# Patient Record
Sex: Male | Born: 1940
Health system: Southern US, Community
[De-identification: ages and names within clinical notes are randomized; demographics above are authoritative.]

## PROBLEM LIST (undated history)

## (undated) DIAGNOSIS — I700 Atherosclerosis of aorta: Secondary | ICD-10-CM

## (undated) DIAGNOSIS — F32A Depression, unspecified: Secondary | ICD-10-CM

## (undated) DIAGNOSIS — C4491 Basal cell carcinoma of skin, unspecified: Secondary | ICD-10-CM

## (undated) DIAGNOSIS — G4700 Insomnia, unspecified: Secondary | ICD-10-CM

## (undated) DIAGNOSIS — N3010 Interstitial cystitis (chronic) without hematuria: Secondary | ICD-10-CM

## (undated) DIAGNOSIS — Z85828 Personal history of other malignant neoplasm of skin: Secondary | ICD-10-CM

## (undated) DIAGNOSIS — Z87442 Personal history of urinary calculi: Secondary | ICD-10-CM

## (undated) DIAGNOSIS — F329 Major depressive disorder, single episode, unspecified: Secondary | ICD-10-CM

## (undated) DIAGNOSIS — K219 Gastro-esophageal reflux disease without esophagitis: Secondary | ICD-10-CM

## (undated) DIAGNOSIS — E785 Hyperlipidemia, unspecified: Secondary | ICD-10-CM

## (undated) DIAGNOSIS — C61 Malignant neoplasm of prostate: Secondary | ICD-10-CM

## (undated) HISTORY — PX: COLONOSCOPY: SHX174

## (undated) HISTORY — PX: NO PAST SURGERIES: SHX2092

---

## 2007-10-17 ENCOUNTER — Encounter (INDEPENDENT_AMBULATORY_CARE_PROVIDER_SITE_OTHER): Payer: Self-pay | Admitting: Gastroenterology

## 2007-10-17 ENCOUNTER — Ambulatory Visit (HOSPITAL_COMMUNITY): Admission: RE | Admit: 2007-10-17 | Discharge: 2007-10-17 | Payer: Self-pay | Admitting: Gastroenterology

## 2009-10-09 ENCOUNTER — Ambulatory Visit (HOSPITAL_COMMUNITY): Admission: RE | Admit: 2009-10-09 | Discharge: 2009-10-09 | Payer: Self-pay | Admitting: Family Medicine

## 2010-07-03 LAB — BASIC METABOLIC PANEL
Calcium: 9.5 mg/dL (ref 8.4–10.5)
Creatinine, Ser: 2.12 mg/dL — ABNORMAL HIGH (ref 0.4–1.5)
GFR calc Af Amer: 38 mL/min — ABNORMAL LOW (ref >60)
Glucose, Bld: 90 mg/dL (ref 70–99)
Sodium: 137 mEq/L (ref 135–145)

## 2014-02-08 ENCOUNTER — Encounter (HOSPITAL_COMMUNITY): Payer: Self-pay | Admitting: Emergency Medicine

## 2014-02-08 ENCOUNTER — Emergency Department (HOSPITAL_COMMUNITY): Payer: Medicare Other

## 2014-02-08 ENCOUNTER — Emergency Department (HOSPITAL_COMMUNITY)
Admission: EM | Admit: 2014-02-08 | Discharge: 2014-02-08 | Disposition: A | Discharge status: Home / Self Care | Payer: Medicare Other | Attending: Emergency Medicine | Admitting: Emergency Medicine

## 2014-02-08 DIAGNOSIS — W132XXA Fall from, out of or through roof, initial encounter: Secondary | ICD-10-CM | POA: Diagnosis not present

## 2014-02-08 DIAGNOSIS — Y9289 Other specified places as the place of occurrence of the external cause: Secondary | ICD-10-CM | POA: Insufficient documentation

## 2014-02-08 DIAGNOSIS — I803 Phlebitis and thrombophlebitis of lower extremities, unspecified: Secondary | ICD-10-CM | POA: Insufficient documentation

## 2014-02-08 DIAGNOSIS — Y9389 Activity, other specified: Secondary | ICD-10-CM | POA: Diagnosis not present

## 2014-02-08 DIAGNOSIS — M7989 Other specified soft tissue disorders: Secondary | ICD-10-CM

## 2014-02-08 DIAGNOSIS — S8012XA Contusion of left lower leg, initial encounter: Secondary | ICD-10-CM | POA: Insufficient documentation

## 2014-02-08 DIAGNOSIS — S8992XA Unspecified injury of left lower leg, initial encounter: Secondary | ICD-10-CM | POA: Diagnosis present

## 2014-02-08 DIAGNOSIS — M79609 Pain in unspecified limb: Secondary | ICD-10-CM

## 2014-02-08 DIAGNOSIS — Z79899 Other long term (current) drug therapy: Secondary | ICD-10-CM | POA: Insufficient documentation

## 2014-02-08 NOTE — ED Notes (Signed)
Pt states he was standing on the roof of his pool shelter (approximately 3 1/2 feet high) and fell though it.  Pt states initially he only had abrasions to the left leg.  Yesterday a knot came up just below his knee medially and he has L foot swelling.  Pt states he has been ambulating on the extremity without injury.

## 2014-02-08 NOTE — ED Provider Notes (Addendum)
CSN: [id number]     Arrival date & time 02/08/14  1311 History   First MD Initiated Contact with Patient 02/08/14 1327     Chief Complaint  Patient presents with  . Leg Injury     (Consider location/radiation/quality/duration/timing/severity/associated sxs/prior Treatment) HPI Comments: Pt with initial injury 6 days pta, who was able to walk and stand with minimal pain but then 3 days pta drove to [facility] and back and started to notice increased swelling, new calf pain  Patient is a 73 y.o. male presenting with leg pain. The history is provided by the patient.  Leg Pain Location:  Leg Time since incident:  6 days Injury: yes   Mechanism of injury: fall   Fall:    Fall occurred: fell several feet from a roof onto a pool motor.   Impact surface:  Concrete   Point of impact:  Feet Leg location:  L lower leg Pain details:    Quality:  Aching   Radiates to:  Does not radiate   Severity:  Mild   Onset quality:  Gradual   Duration:  6 days   Timing:  Constant   Progression:  Worsening Chronicity:  New Relieved by:  Rest and elevation Worsened by:  Nothing tried Ineffective treatments:  None tried Associated symptoms comment:  Came today due to worsening bruising and swelling and new calf pain   History reviewed. No pertinent past medical history. History reviewed. No pertinent past surgical history. No family history on file. History  Substance Use Topics  . Smoking status: Never Smoker   . Smokeless tobacco: Not on file  . Alcohol Use: Yes    Review of Systems  Respiratory: Negative for cough, chest tightness and shortness of breath.   All other systems reviewed and are negative.     Allergies  Review of patient's allergies indicates no known allergies.  Home Medications   Prior to Admission medications   Medication Sig Start Date End Date Taking? Authorizing Provider  atorvastatin (LIPITOR) 40 MG tablet Take 40 mg by mouth daily.   Yes Historical Provider, MD    esomeprazole (NEXIUM) 40 MG capsule Take 40 mg by mouth daily at 12 noon.   Yes Historical Provider, MD   BP 147/82  Pulse 86  Resp 18  SpO2 96% Physical Exam  Nursing note and vitals reviewed. Constitutional: He is oriented to person, place, and time. He appears well-developed and well-nourished. No distress.  HENT:  Head: Normocephalic and atraumatic.  Eyes: EOM are normal. Pupils are equal, round, and reactive to light.  Cardiovascular: Normal rate.   Pulmonary/Chest: Effort normal.  Musculoskeletal: He exhibits tenderness.       Legs: Neurological: He is alert and oriented to person, place, and time.    ED Course  Procedures (including critical care time) Labs Review Labs Reviewed - No data to display  Imaging Review Dg Tibia/fibula Left  02/08/2014   CLINICAL DATA:  Patient fell through a roof Monday injuring his left leg. Patient has bruising along the leg and foot edema.  EXAM: LEFT TIBIA AND FIBULA - 2 VIEW  COMPARISON:  None.  FINDINGS: No fracture or bone lesion. Knee and ankle joints are normally space and aligned. Mild subcutaneous soft tissue edema is seen diffusely. No radiopaque foreign bodies.  IMPRESSION: No fracture or dislocation.   Electronically Signed   By: Lajean Manes M.D.   On: 02/08/2014 14:04     EKG Interpretation None      MDM  Final diagnoses:  Leg hematoma, left, initial encounter   Swelling, ecchymosis and calf pain with hematoma.  however due to recent trauma then travel will r/o dvt.  X-ray neg.  Doppler pending  3:21 PM Duplex neg and only mild thrombophlebitis.  Will d/c home.   Blanchie Dessert, MD 02/08/14 [place], MD 02/08/14 [phone]

## 2014-02-08 NOTE — Progress Notes (Signed)
VASCULAR LAB PRELIMINARY  PRELIMINARY  PRELIMINARY  PRELIMINARY  Left lower extremity venous Doppler completed.    Preliminary report:  There is no DVT or SVT noted in the left lower extremity.  Violetta Lavalle, RVT 02/08/2014, 3:10 PM

## 2014-02-08 NOTE — ED Notes (Signed)
Pt transported to vascular.  °

## 2014-09-29 DIAGNOSIS — R972 Elevated prostate specific antigen [PSA]: Secondary | ICD-10-CM | POA: Diagnosis not present

## 2014-10-06 DIAGNOSIS — N401 Enlarged prostate with lower urinary tract symptoms: Secondary | ICD-10-CM | POA: Diagnosis not present

## 2014-10-06 DIAGNOSIS — N138 Other obstructive and reflux uropathy: Secondary | ICD-10-CM | POA: Diagnosis not present

## 2014-10-06 DIAGNOSIS — R972 Elevated prostate specific antigen [PSA]: Secondary | ICD-10-CM | POA: Diagnosis not present

## 2014-12-29 DIAGNOSIS — R208 Other disturbances of skin sensation: Secondary | ICD-10-CM | POA: Diagnosis not present

## 2014-12-29 DIAGNOSIS — Z23 Encounter for immunization: Secondary | ICD-10-CM | POA: Diagnosis not present

## 2015-01-05 DIAGNOSIS — R972 Elevated prostate specific antigen [PSA]: Secondary | ICD-10-CM | POA: Diagnosis not present

## 2015-03-19 DIAGNOSIS — R972 Elevated prostate specific antigen [PSA]: Secondary | ICD-10-CM | POA: Diagnosis not present

## 2015-03-30 DIAGNOSIS — N138 Other obstructive and reflux uropathy: Secondary | ICD-10-CM | POA: Diagnosis not present

## 2015-03-30 DIAGNOSIS — R972 Elevated prostate specific antigen [PSA]: Secondary | ICD-10-CM | POA: Diagnosis not present

## 2015-03-30 DIAGNOSIS — N401 Enlarged prostate with lower urinary tract symptoms: Secondary | ICD-10-CM | POA: Diagnosis not present

## 2015-03-30 DIAGNOSIS — N402 Nodular prostate without lower urinary tract symptoms: Secondary | ICD-10-CM | POA: Diagnosis not present

## 2015-04-20 DIAGNOSIS — C61 Malignant neoplasm of prostate: Secondary | ICD-10-CM | POA: Diagnosis not present

## 2015-04-20 DIAGNOSIS — N402 Nodular prostate without lower urinary tract symptoms: Secondary | ICD-10-CM | POA: Diagnosis not present

## 2015-04-20 DIAGNOSIS — R972 Elevated prostate specific antigen [PSA]: Secondary | ICD-10-CM | POA: Diagnosis not present

## 2015-04-30 ENCOUNTER — Other Ambulatory Visit (HOSPITAL_COMMUNITY): Payer: Self-pay | Admitting: Urology

## 2015-04-30 DIAGNOSIS — Z0000 Encounter for general adult medical examination without abnormal findings: Secondary | ICD-10-CM | POA: Diagnosis not present

## 2015-04-30 DIAGNOSIS — C61 Malignant neoplasm of prostate: Secondary | ICD-10-CM | POA: Diagnosis not present

## 2015-05-04 ENCOUNTER — Encounter (HOSPITAL_COMMUNITY)
Admission: RE | Admit: 2015-05-04 | Discharge: 2015-05-04 | Disposition: A | Discharge status: Home / Self Care | Payer: Medicare Other | Source: Ambulatory Visit | Attending: Urology | Admitting: Urology

## 2015-05-04 DIAGNOSIS — C61 Malignant neoplasm of prostate: Secondary | ICD-10-CM | POA: Insufficient documentation

## 2015-05-04 MED ORDER — TECHNETIUM TC 99M MEDRONATE IV KIT
25.0000 | PACK | Freq: Once | INTRAVENOUS | Status: AC | PRN
  Administered 2015-05-04: 25 via INTRAVENOUS

## 2015-05-24 DIAGNOSIS — H43813 Vitreous degeneration, bilateral: Secondary | ICD-10-CM | POA: Diagnosis not present

## 2015-05-24 DIAGNOSIS — H5203 Hypermetropia, bilateral: Secondary | ICD-10-CM | POA: Diagnosis not present

## 2015-05-24 DIAGNOSIS — H524 Presbyopia: Secondary | ICD-10-CM | POA: Diagnosis not present

## 2015-05-24 DIAGNOSIS — H2513 Age-related nuclear cataract, bilateral: Secondary | ICD-10-CM | POA: Diagnosis not present

## 2015-05-25 DIAGNOSIS — Z0000 Encounter for general adult medical examination without abnormal findings: Secondary | ICD-10-CM | POA: Diagnosis not present

## 2015-05-25 DIAGNOSIS — C61 Malignant neoplasm of prostate: Secondary | ICD-10-CM | POA: Diagnosis not present

## 2015-05-27 ENCOUNTER — Other Ambulatory Visit (HOSPITAL_COMMUNITY): Payer: Self-pay | Admitting: Urology

## 2015-05-27 ENCOUNTER — Other Ambulatory Visit: Payer: Self-pay | Admitting: Urology

## 2015-05-27 DIAGNOSIS — C61 Malignant neoplasm of prostate: Secondary | ICD-10-CM

## 2015-06-01 DIAGNOSIS — C61 Malignant neoplasm of prostate: Secondary | ICD-10-CM | POA: Diagnosis not present

## 2015-06-01 DIAGNOSIS — M6281 Muscle weakness (generalized): Secondary | ICD-10-CM | POA: Diagnosis not present

## 2015-06-17 DIAGNOSIS — N3010 Interstitial cystitis (chronic) without hematuria: Secondary | ICD-10-CM | POA: Diagnosis not present

## 2015-06-17 DIAGNOSIS — C61 Malignant neoplasm of prostate: Secondary | ICD-10-CM | POA: Diagnosis not present

## 2015-06-17 DIAGNOSIS — R278 Other lack of coordination: Secondary | ICD-10-CM | POA: Diagnosis not present

## 2015-06-17 DIAGNOSIS — M6281 Muscle weakness (generalized): Secondary | ICD-10-CM | POA: Diagnosis not present

## 2015-06-18 DIAGNOSIS — F418 Other specified anxiety disorders: Secondary | ICD-10-CM | POA: Diagnosis not present

## 2015-06-18 DIAGNOSIS — R972 Elevated prostate specific antigen [PSA]: Secondary | ICD-10-CM | POA: Diagnosis not present

## 2015-06-18 DIAGNOSIS — R200 Anesthesia of skin: Secondary | ICD-10-CM | POA: Diagnosis not present

## 2015-06-21 ENCOUNTER — Ambulatory Visit (HOSPITAL_COMMUNITY): Payer: Medicare Other

## 2015-06-24 ENCOUNTER — Ambulatory Visit (HOSPITAL_COMMUNITY)
Admission: RE | Admit: 2015-06-24 | Discharge: 2015-06-24 | Disposition: A | Discharge status: Home / Self Care | Payer: Medicare Other | Source: Ambulatory Visit | Attending: Urology | Admitting: Urology

## 2015-06-24 DIAGNOSIS — Z8546 Personal history of malignant neoplasm of prostate: Secondary | ICD-10-CM | POA: Diagnosis not present

## 2015-06-24 DIAGNOSIS — C61 Malignant neoplasm of prostate: Secondary | ICD-10-CM

## 2015-06-24 DIAGNOSIS — R938 Abnormal findings on diagnostic imaging of other specified body structures: Secondary | ICD-10-CM | POA: Insufficient documentation

## 2015-06-24 LAB — POCT I-STAT CREATININE: CREATININE: 1.2 mg/dL (ref 0.61–1.24)

## 2015-06-24 MED ORDER — GADOBENATE DIMEGLUMINE 529 MG/ML IV SOLN
20.0000 mL | Freq: Once | INTRAVENOUS | Status: AC | PRN
  Administered 2015-06-24: 17 mL via INTRAVENOUS

## 2015-06-25 NOTE — Patient Instructions (Addendum)
YOUR PROCEDURE IS SCHEDULED ON :  07/01/15  REPORT TO [facility] MAIN ENTRANCE FOLLOW SIGNS TO EAST ELEVATOR - GO TO 3rd FLOOR CHECK IN AT 3 EAST NURSES STATION (SHORT STAY) AT:  5:15 AM  CALL THIS NUMBER IF YOU HAVE PROBLEMS THE MORNING OF SURGERY [phone]  REMEMBER:ONLY 1 PER PERSON MAY GO TO SHORT STAY WITH YOU TO GET READY THE MORNING OF YOUR SURGERY  DO NOT EAT FOOD OR DRINK LIQUIDS AFTER MIDNIGHT  TAKE THESE MEDICINES THE MORNING OF SURGERY: AMOXICILLIN  YOU MAY NOT HAVE ANY METAL ON YOUR BODY INCLUDING HAIR PINS AND PIERCING'S. DO NOT WEAR JEWELRY, MAKEUP, LOTIONS, POWDERS OR PERFUMES. DO NOT WEAR NAIL POLISH. DO NOT SHAVE 48 HRS PRIOR TO SURGERY. MEN MAY SHAVE FACE AND NECK.  DO NOT [place]. [facility] IS NOT RESPONSIBLE FOR VALUABLES.  CONTACTS, DENTURES OR PARTIALS MAY NOT BE WORN TO SURGERY. LEAVE SUITCASE IN CAR. CAN BE BROUGHT TO ROOM AFTER SURGERY.  PATIENTS DISCHARGED THE DAY OF SURGERY WILL NOT BE ALLOWED TO DRIVE HOME.  PLEASE READ OVER THE FOLLOWING INSTRUCTION SHEETS _________________________________________________________________________________                                           - PREPARING FOR SURGERY  Before surgery, you can play an important role.  Because skin is not sterile, your skin needs to be as free of germs as possible.  You can reduce the number of germs on your skin by washing with CHG (chlorahexidine gluconate) soap before surgery.  CHG is an antiseptic cleaner which kills germs and bonds with the skin to continue killing germs even after washing. Please DO NOT use if you have an allergy to CHG or antibacterial soaps.  If your skin becomes reddened/irritated stop using the CHG and inform your nurse when you arrive at Short Stay. Do not shave (including legs and underarms) for at least 48 hours prior to the first CHG shower.  You may shave your face. Please follow these instructions  carefully:   1.  Shower with CHG Soap the night before surgery and the  morning of Surgery.   2.  If you choose to wash your hair, wash your hair first as usual with your  normal  Shampoo.   3.  After you shampoo, rinse your hair and body thoroughly to remove the  shampoo.                                         4.  Use CHG as you would any other liquid soap.  You can apply chg directly  to the skin and wash . Gently wash with scrungie or clean wascloth    5.  Apply the CHG Soap to your body ONLY FROM THE NECK DOWN.   Do not use on open                           Wound or open sores. Avoid contact with eyes, ears mouth and genitals (private parts).                        Genitals (private parts) with your normal soap.  6.  Wash thoroughly, paying special attention to the area where your surgery  will be performed.   7.  Thoroughly rinse your body with warm water from the neck down.   8.  DO NOT shower/wash with your normal soap after using and rinsing off  the CHG Soap .                9.  Pat yourself dry with a clean towel.             10.  Wear clean night clothes to bed after shower             11.  Place clean sheets on your bed the night of your first shower and do not  sleep with pets.  Day of Surgery : Do not apply any lotions/deodorants the morning of surgery.  Please wear clean clothes to the hospital/surgery center.  FAILURE TO FOLLOW THESE INSTRUCTIONS MAY RESULT IN THE CANCELLATION OF YOUR SURGERY    PATIENT SIGNATURE_________________________________  ______________________________________________________________________ WHAT IS A BLOOD TRANSFUSION? Blood Transfusion Information  A transfusion is the replacement of blood or some of its parts. Blood is made up of multiple cells which provide different functions.  Red blood cells carry oxygen and are used for blood loss replacement.  White blood cells fight against infection.  Platelets control  bleeding.  Plasma helps clot blood.  Other blood products are available for specialized needs, such as hemophilia or other clotting disorders. BEFORE THE TRANSFUSION  Who gives blood for transfusions?   Healthy volunteers who are fully evaluated to make sure their blood is safe. This is blood bank blood. Transfusion therapy is the safest it has ever been in the practice of medicine. Before blood is taken from a donor, a complete history is taken to make sure that person has no history of diseases nor engages in risky social behavior (examples are intravenous drug use or sexual activity with multiple partners). The donor's travel history is screened to minimize risk of transmitting infections, such as malaria. The donated blood is tested for signs of infectious diseases, such as HIV and hepatitis. The blood is then tested to be sure it is compatible with you in order to minimize the chance of a transfusion reaction. If you or a relative donates blood, this is often done in anticipation of surgery and is not appropriate for emergency situations. It takes many days to process the donated blood. RISKS AND COMPLICATIONS Although transfusion therapy is very safe and saves many lives, the main dangers of transfusion include:   Getting an infectious disease.  Developing a transfusion reaction. This is an allergic reaction to something in the blood you were given. Every precaution is taken to prevent this. The decision to have a blood transfusion has been considered carefully by your caregiver before blood is given. Blood is not given unless the benefits outweigh the risks. AFTER THE TRANSFUSION  Right after receiving a blood transfusion, you will usually feel much better and more energetic. This is especially true if your red blood cells have gotten low (anemic). The transfusion raises the level of the red blood cells which carry oxygen, and this usually causes an energy increase.  The nurse  administering the transfusion will monitor you carefully for complications. HOME CARE INSTRUCTIONS  No special instructions are needed after a transfusion. You may find your energy is better. Speak with your caregiver about any limitations on activity for underlying diseases you may have. [place]  IF:   Your condition is not improving after your transfusion.  You develop redness or irritation at the intravenous (IV) site. SEEK IMMEDIATE MEDICAL CARE IF:  Any of the following symptoms occur over the next 12 hours:  Shaking chills.  You have a temperature by mouth above 102 F (38.9 C), not controlled by medicine.  Chest, back, or muscle pain.  People around you feel you are not acting correctly or are confused.  Shortness of breath or difficulty breathing.  Dizziness and fainting.  You get a rash or develop hives.  You have a decrease in urine output.  Your urine turns a dark color or changes to pink, red, or brown. Any of the following symptoms occur over the next 10 days:  You have a temperature by mouth above 102 F (38.9 C), not controlled by medicine.  Shortness of breath.  Weakness after normal activity.  The white part of the eye turns yellow (jaundice).  You have a decrease in the amount of urine or are urinating less often.  Your urine turns a dark color or changes to pink, red, or brown. Document Released: 03/31/2000 Document Revised: 06/26/2011 Document Reviewed: 11/18/2007 [facility] Patient Information 2014 [place], [place].  _______________________________________________________________________

## 2015-06-29 ENCOUNTER — Encounter (HOSPITAL_COMMUNITY): Payer: Self-pay

## 2015-06-29 ENCOUNTER — Encounter (HOSPITAL_COMMUNITY)
Admission: RE | Admit: 2015-06-29 | Discharge: 2015-06-29 | Disposition: A | Discharge status: Home / Self Care | Payer: Medicare Other | Source: Ambulatory Visit | Attending: Urology | Admitting: Urology

## 2015-06-29 ENCOUNTER — Ambulatory Visit (HOSPITAL_COMMUNITY)
Admission: RE | Admit: 2015-06-29 | Discharge: 2015-06-29 | Disposition: A | Discharge status: Home / Self Care | Payer: Medicare Other | Source: Ambulatory Visit | Attending: Urology | Admitting: Urology

## 2015-06-29 DIAGNOSIS — Z7982 Long term (current) use of aspirin: Secondary | ICD-10-CM

## 2015-06-29 DIAGNOSIS — N3010 Interstitial cystitis (chronic) without hematuria: Secondary | ICD-10-CM | POA: Diagnosis not present

## 2015-06-29 DIAGNOSIS — N529 Male erectile dysfunction, unspecified: Secondary | ICD-10-CM | POA: Diagnosis present

## 2015-06-29 DIAGNOSIS — R278 Other lack of coordination: Secondary | ICD-10-CM | POA: Diagnosis not present

## 2015-06-29 DIAGNOSIS — C61 Malignant neoplasm of prostate: Secondary | ICD-10-CM

## 2015-06-29 DIAGNOSIS — K219 Gastro-esophageal reflux disease without esophagitis: Secondary | ICD-10-CM | POA: Diagnosis present

## 2015-06-29 DIAGNOSIS — E7800 Pure hypercholesterolemia, unspecified: Secondary | ICD-10-CM | POA: Diagnosis not present

## 2015-06-29 DIAGNOSIS — M6281 Muscle weakness (generalized): Secondary | ICD-10-CM | POA: Diagnosis not present

## 2015-06-29 DIAGNOSIS — Z01818 Encounter for other preprocedural examination: Secondary | ICD-10-CM | POA: Diagnosis not present

## 2015-06-29 HISTORY — DX: Personal history of other malignant neoplasm of skin: Z85.828

## 2015-06-29 HISTORY — DX: Gastro-esophageal reflux disease without esophagitis: K21.9

## 2015-06-29 HISTORY — DX: Interstitial cystitis (chronic) without hematuria: N30.10

## 2015-06-29 HISTORY — DX: Malignant neoplasm of prostate: C61

## 2015-06-29 HISTORY — DX: Personal history of urinary calculi: Z87.442

## 2015-06-29 HISTORY — DX: Hyperlipidemia, unspecified: E78.5

## 2015-06-29 LAB — CBC
HCT: 44.2 % (ref 39.0–52.0)
Hemoglobin: 14.3 g/dL (ref 13.0–17.0)
MCH: 31.4 pg (ref 26.0–34.0)
MCHC: 32.4 g/dL (ref 30.0–36.0)
MCV: 97.1 fL (ref 78.0–100.0)
PLATELETS: 255 10*3/uL (ref 150–400)
RBC: 4.55 MIL/uL (ref 4.22–5.81)
RDW: 13.1 % (ref 11.5–15.5)
WBC: 6 10*3/uL (ref 4.0–10.5)

## 2015-06-29 LAB — BASIC METABOLIC PANEL
Anion gap: 9 (ref 5–15)
BUN: 13 mg/dL (ref 6–20)
CALCIUM: 9.4 mg/dL (ref 8.9–10.3)
CO2: 26 mmol/L (ref 22–32)
Chloride: 103 mmol/L (ref 101–111)
Creatinine, Ser: 1.2 mg/dL (ref 0.61–1.24)
GFR calc Af Amer: >60 mL/min (ref >60)
GFR, EST NON AFRICAN AMERICAN: 59 mL/min — AB (ref >60)
GLUCOSE: 108 mg/dL — AB (ref 65–99)
Potassium: 3.7 mmol/L (ref 3.5–5.1)
SODIUM: 138 mmol/L (ref 135–145)

## 2015-06-29 LAB — ABO/RH: ABO/RH(D): O POS

## 2015-06-30 NOTE — H&P (Signed)
Chief Complaint Prostate Cancer   History of Present Illness Jared Jacobs is a 75 year old healthy gentleman who has a history of a fluctuating PSA and was found to have an elevated PSA of 9.81 and a right apical prostate nodule recently. He underwent a TRUS biopsy on 04/20/15 that confirmed Gleason 4+3=7 adenocarcinoma with 12 out of 12 biopsy cores positive for malignancy. He underwent staging studies including a bone scan and CT of the pelvis on 05/04/15. His bone scan was negative for metastatic disease with the only concern being some uptake in the lower pelvis felt to probably be related to urine contamination. The CT of the pelvis demonstrated no pelvic lymphadenopathy or other evidence of metastatic disease including no concerning skeletal lesions of the pelvis. He does have a long-standing urologic history and was diagnosed with interstitial cystitis in the late 1980s. This has been very well-managed with amitriptyline. His main symptom is pelvic pain related to bladder filling relieved with voiding.   He otherwise is very healthy and active exercising regularly multiple times per week.   TNM stage: cT2b N0 M0 (R apex) PSA: 9.81 Gleason score: 4+3=7 Biopsy (04/20/15): 12/12 cores positive   Left: L lateral apex (50%, 3+4=7), L apex (70%, 3+3=6), L lateral mid (40%, 3+3=6, PNI), L mid (5%, 3+4=7), L lateral base (20%, 3+4=7), L base (30%, 3+4=7)   Right: R apex (50%, 3+4=7), R lateral apex (70%, 3+3=6), R mid (60%, 4+3=7), R lateral mid (50%, 3+4=7), R base (<5%, 4+3=7), R lateral base (40%, 3+3=6) Prostate volume: 42.6 cc  Nomogram OC disease: 27% EPE: 70% SVI: 14% LNI: 15% PFS (surgery): 37% at 5 years, 23% at 10 years  Urinary function: He does have significant lower urinary tract symptoms including a sense of incomplete emptying, frequency, intermittency, urgency, weak stream, straining, and nocturia one time per night. IPSS is 22.  Erectile function: He does have moderate  erectile dysfunction and estimated that he could achieve an erection adequate for intercourse approximately 30% of the time on his own. However, he does respond very well to PDE 5 inhibitors and can reliably get an erection when taking Cialis 5 mg daily.     Past Medical History Problems  1. History of esophageal reflux (Z87.19) 2. History of hypercholesterolemia (Z86.39)  Surgical History Problems  1. History of No Surgical Problems  Current Meds 1. Amitriptyline HCl TABS;  Therapy: (Recorded:18Jan2008) to Recorded 2. Aspirin 81 MG TABS;  Therapy: (Recorded:10Jan2008) to Recorded 3. Cialis 5 MG Oral Tablet; TAKE 1 TABLET As Directed (Daily use tablets);  Therapy: 21Jun2016 to (Evaluate:16Jun2017)  Requested for: 21Jun2016; Last  Rx:21Jun2016 Ordered 4. Lipitor 40 MG Oral Tablet (Atorvastatin Calcium);  Therapy: (Recorded:10Jan2008) to Recorded 5. NexIUM 40 MG Oral Capsule Delayed Release (Esomeprazole Magnesium);  Therapy: (Recorded:05Apr2012) to Recorded 6. Uribel 118 MG Oral Capsule; TAKE 1 CAPSULE 4 times daily PRN urgency;  Therapy: [id number] to (Last Rx:18Dec2015)  Requested for: 18Dec2015 Ordered  Allergies Medication  1. No Known Drug Allergies  Family History Problems  1. Family history of Brain Cancer : Brother 2. Family history of Cancer : Father   colon 3. Family history of Family Health Status Number Of Children   1 son 4. Family history of Kidney Cancer : Mother  There is no specific family history of prostate cancer. His mother has a history of kidney cancer and ovarian cancer. His father died of colon cancer.   Social History Problems    Alcohol Use (History)   1-2 per  week   Family history of Death In The Family Father   92   Family history of Death In The Family Mother   20   Marital History - Single   Never A Smoker   Occupation:   Optometrist   Denied: Tobacco Use  Review of Systems Constitutional, skin, eye, otolaryngeal,  hematologic/lymphatic, cardiovascular, pulmonary, endocrine, musculoskeletal, gastrointestinal, neurological and psychiatric system(s) were reviewed and pertinent findings if present are noted and are otherwise negative.    Vitals  Weight: 174 lb  BMI Calculated: 24.97 BSA Calculated: 1.97   Physical Exam Constitutional: Well nourished and well developed . No acute distress.  ENT:. The ears and nose are normal in appearance.  Neck: The appearance of the neck is normal and no neck mass is present.  Pulmonary: No respiratory distress, normal respiratory rhythm and effort and clear bilateral breath sounds.  Cardiovascular: Heart rate and rhythm are normal . No peripheral edema.  Abdomen: The abdomen is soft and nontender. No masses are palpated. No CVA tenderness. No hernias are palpable. No hepatosplenomegaly noted.    Assessment Assessed  1. Adenocarcinoma of prostate (C61)    Discussion/Summary 1. Prostate cancer: He has elected to proceed with surgical therapy.  He did undergo a preoperative MRI.  He will undergo a bilateral nerve sparing robot-assisted laparoscopic radical prostatectomy.

## 2015-07-01 ENCOUNTER — Inpatient Hospital Stay (HOSPITAL_COMMUNITY): Payer: Medicare Other | Admitting: Certified Registered Nurse Anesthetist

## 2015-07-01 ENCOUNTER — Encounter (HOSPITAL_COMMUNITY): Payer: Self-pay | Admitting: *Deleted

## 2015-07-01 ENCOUNTER — Inpatient Hospital Stay (HOSPITAL_COMMUNITY)
Admission: RE | Admit: 2015-07-01 | Discharge: 2015-07-02 | DRG: 708 | Disposition: A | Discharge status: Home / Self Care | Payer: Medicare Other | Source: Ambulatory Visit | Attending: Urology | Admitting: Urology

## 2015-07-01 ENCOUNTER — Encounter (HOSPITAL_COMMUNITY)
Admission: RE | Disposition: A | Discharge status: Home / Self Care | Payer: Self-pay | Source: Ambulatory Visit | Attending: Urology

## 2015-07-01 DIAGNOSIS — C775 Secondary and unspecified malignant neoplasm of intrapelvic lymph nodes: Secondary | ICD-10-CM | POA: Diagnosis not present

## 2015-07-01 DIAGNOSIS — E7800 Pure hypercholesterolemia, unspecified: Secondary | ICD-10-CM | POA: Diagnosis not present

## 2015-07-01 DIAGNOSIS — Z7982 Long term (current) use of aspirin: Secondary | ICD-10-CM | POA: Diagnosis not present

## 2015-07-01 DIAGNOSIS — N529 Male erectile dysfunction, unspecified: Secondary | ICD-10-CM | POA: Diagnosis not present

## 2015-07-01 DIAGNOSIS — C61 Malignant neoplasm of prostate: Secondary | ICD-10-CM | POA: Diagnosis present

## 2015-07-01 DIAGNOSIS — K219 Gastro-esophageal reflux disease without esophagitis: Secondary | ICD-10-CM | POA: Diagnosis not present

## 2015-07-01 HISTORY — PX: ROBOT ASSISTED LAPAROSCOPIC RADICAL PROSTATECTOMY: SHX5141

## 2015-07-01 HISTORY — PX: LYMPHADENECTOMY: SHX5960

## 2015-07-01 LAB — TYPE AND SCREEN
ABO/RH(D): O POS
Antibody Screen: NEGATIVE

## 2015-07-01 LAB — HEMOGLOBIN AND HEMATOCRIT, BLOOD
HEMATOCRIT: 38.8 % — AB (ref 39.0–52.0)
Hemoglobin: 12.5 g/dL — ABNORMAL LOW (ref 13.0–17.0)

## 2015-07-01 SURGERY — XI ROBOTIC ASSISTED LAPAROSCOPIC RADICAL PROSTATECTOMY LEVEL 2
Anesthesia: General

## 2015-07-01 MED ORDER — PROMETHAZINE HCL 25 MG/ML IJ SOLN: 6.2500 mg | INTRAMUSCULAR | Status: DC | PRN

## 2015-07-01 MED ORDER — LACTATED RINGERS IV SOLN: INTRAVENOUS | Status: DC

## 2015-07-01 MED ORDER — FENTANYL CITRATE (PF) 250 MCG/5ML IJ SOLN
INTRAMUSCULAR | Status: AC
  Filled 2015-07-01: qty 5

## 2015-07-01 MED ORDER — HYDROMORPHONE HCL 1 MG/ML IJ SOLN
INTRAMUSCULAR | Status: AC
  Filled 2015-07-01: qty 1

## 2015-07-01 MED ORDER — MIDAZOLAM HCL 5 MG/5ML IJ SOLN
INTRAMUSCULAR | Status: DC | PRN
  Administered 2015-07-01: 2 mg via INTRAVENOUS

## 2015-07-01 MED ORDER — ACETAMINOPHEN 10 MG/ML IV SOLN
INTRAVENOUS | Status: AC
  Filled 2015-07-01: qty 100

## 2015-07-01 MED ORDER — DIPHENHYDRAMINE HCL 50 MG/ML IJ SOLN: 12.5000 mg | Freq: Four times a day (QID) | INTRAMUSCULAR | Status: DC | PRN

## 2015-07-01 MED ORDER — HYDROMORPHONE HCL 2 MG/ML IJ SOLN
INTRAMUSCULAR | Status: AC
  Filled 2015-07-01: qty 1

## 2015-07-01 MED ORDER — SUCCINYLCHOLINE CHLORIDE 20 MG/ML IJ SOLN
INTRAMUSCULAR | Status: DC | PRN
  Administered 2015-07-01: 100 mg via INTRAVENOUS

## 2015-07-01 MED ORDER — MIDAZOLAM HCL 2 MG/2ML IJ SOLN
INTRAMUSCULAR | Status: AC
  Filled 2015-07-01: qty 2

## 2015-07-01 MED ORDER — ONDANSETRON HCL 4 MG/2ML IJ SOLN
INTRAMUSCULAR | Status: DC | PRN
  Administered 2015-07-01: 4 mg via INTRAVENOUS

## 2015-07-01 MED ORDER — KCL IN DEXTROSE-NACL 20-5-0.45 MEQ/L-%-% IV SOLN
INTRAVENOUS | Status: DC
  Administered 2015-07-01 – 2015-07-02 (×3): via INTRAVENOUS
  Filled 2015-07-01 (×4): qty 1000

## 2015-07-01 MED ORDER — SODIUM CHLORIDE 0.9 % IJ SOLN
INTRAMUSCULAR | Status: AC
  Filled 2015-07-01: qty 10

## 2015-07-01 MED ORDER — CEFAZOLIN SODIUM-DEXTROSE 2-3 GM-% IV SOLR
INTRAVENOUS | Status: AC
  Filled 2015-07-01: qty 50

## 2015-07-01 MED ORDER — DOCUSATE SODIUM 100 MG PO CAPS
100.0000 mg | ORAL_CAPSULE | Freq: Two times a day (BID) | ORAL | Status: DC
  Administered 2015-07-01 – 2015-07-02 (×2): 100 mg via ORAL
  Filled 2015-07-01 (×2): qty 1

## 2015-07-01 MED ORDER — ZOLPIDEM TARTRATE 5 MG PO TABS
5.0000 mg | ORAL_TABLET | Freq: Every evening | ORAL | Status: DC | PRN
  Filled 2015-07-01: qty 1

## 2015-07-01 MED ORDER — DEXAMETHASONE SODIUM PHOSPHATE 10 MG/ML IJ SOLN
INTRAMUSCULAR | Status: AC
  Filled 2015-07-01: qty 1

## 2015-07-01 MED ORDER — SUGAMMADEX SODIUM 200 MG/2ML IV SOLN
INTRAVENOUS | Status: DC | PRN
  Administered 2015-07-01: 200 mg via INTRAVENOUS

## 2015-07-01 MED ORDER — HYDROMORPHONE HCL 1 MG/ML IJ SOLN
INTRAMUSCULAR | Status: DC | PRN
  Administered 2015-07-01 (×5): .2 mg via INTRAVENOUS
  Administered 2015-07-01: .4 mg via INTRAVENOUS
  Administered 2015-07-01 (×3): .2 mg via INTRAVENOUS

## 2015-07-01 MED ORDER — ACETAMINOPHEN 325 MG PO TABS: 650.0000 mg | ORAL_TABLET | ORAL | Status: DC | PRN

## 2015-07-01 MED ORDER — HYDROCODONE-ACETAMINOPHEN 5-325 MG PO TABS: 1.0000 | ORAL_TABLET | Freq: Four times a day (QID) | ORAL | Status: DC | PRN

## 2015-07-01 MED ORDER — KETOROLAC TROMETHAMINE 30 MG/ML IJ SOLN
15.0000 mg | Freq: Four times a day (QID) | INTRAMUSCULAR | Status: DC
  Administered 2015-07-01 – 2015-07-02 (×5): 15 mg via INTRAVENOUS
  Filled 2015-07-01 (×4): qty 1

## 2015-07-01 MED ORDER — LACTATED RINGERS IV SOLN
INTRAVENOUS | Status: DC | PRN
  Administered 2015-07-01: 1 mL

## 2015-07-01 MED ORDER — FENTANYL CITRATE (PF) 100 MCG/2ML IJ SOLN
INTRAMUSCULAR | Status: DC | PRN
  Administered 2015-07-01: 50 ug via INTRAVENOUS
  Administered 2015-07-01: 100 ug via INTRAVENOUS
  Administered 2015-07-01 (×2): 50 ug via INTRAVENOUS

## 2015-07-01 MED ORDER — ACETAMINOPHEN 10 MG/ML IV SOLN
1000.0000 mg | Freq: Once | INTRAVENOUS | Status: AC
  Administered 2015-07-01: 1000 mg via INTRAVENOUS

## 2015-07-01 MED ORDER — HYDROMORPHONE HCL 1 MG/ML IJ SOLN
0.2500 mg | INTRAMUSCULAR | Status: DC | PRN
  Administered 2015-07-01: 0.5 mg via INTRAVENOUS

## 2015-07-01 MED ORDER — SODIUM CHLORIDE 0.9 % IV BOLUS (SEPSIS)
1000.0000 mL | Freq: Once | INTRAVENOUS | Status: AC
  Administered 2015-07-01: 1000 mL via INTRAVENOUS

## 2015-07-01 MED ORDER — LACTATED RINGERS IV SOLN
INTRAVENOUS | Status: DC | PRN
  Administered 2015-07-01 (×2): via INTRAVENOUS

## 2015-07-01 MED ORDER — DEXAMETHASONE SODIUM PHOSPHATE 10 MG/ML IJ SOLN
INTRAMUSCULAR | Status: DC | PRN
  Administered 2015-07-01: 10 mg via INTRAVENOUS

## 2015-07-01 MED ORDER — MORPHINE SULFATE (PF) 2 MG/ML IV SOLN
2.0000 mg | INTRAVENOUS | Status: DC | PRN
  Administered 2015-07-01: 2 mg via INTRAVENOUS
  Filled 2015-07-01: qty 1

## 2015-07-01 MED ORDER — BUPIVACAINE-EPINEPHRINE 0.25% -1:200000 IJ SOLN
INTRAMUSCULAR | Status: DC | PRN
  Administered 2015-07-01: 20 mL

## 2015-07-01 MED ORDER — SUGAMMADEX SODIUM 500 MG/5ML IV SOLN
INTRAVENOUS | Status: AC
  Filled 2015-07-01: qty 5

## 2015-07-01 MED ORDER — AMITRIPTYLINE HCL 25 MG PO TABS
75.0000 mg | ORAL_TABLET | Freq: Every day | ORAL | Status: DC
  Administered 2015-07-01: 75 mg via ORAL
  Filled 2015-07-01: qty 3

## 2015-07-01 MED ORDER — ONDANSETRON HCL 4 MG/2ML IJ SOLN
INTRAMUSCULAR | Status: AC
  Filled 2015-07-01: qty 4

## 2015-07-01 MED ORDER — PHENYLEPHRINE HCL 10 MG/ML IJ SOLN
INTRAMUSCULAR | Status: DC | PRN
  Administered 2015-07-01 (×2): 40 ug via INTRAVENOUS

## 2015-07-01 MED ORDER — PROPOFOL 10 MG/ML IV BOLUS
INTRAVENOUS | Status: AC
  Filled 2015-07-01: qty 20

## 2015-07-01 MED ORDER — BACITRACIN-NEOMYCIN-POLYMYXIN 400-5-5000 EX OINT
1.0000 "application " | TOPICAL_OINTMENT | Freq: Three times a day (TID) | CUTANEOUS | Status: DC | PRN
  Administered 2015-07-01: 1 via TOPICAL
  Filled 2015-07-01: qty 1

## 2015-07-01 MED ORDER — HYDROMORPHONE HCL 1 MG/ML IJ SOLN
0.2500 mg | INTRAMUSCULAR | Status: DC | PRN
  Administered 2015-07-01: 0.25 mg via INTRAVENOUS
  Administered 2015-07-01 (×2): 0.5 mg via INTRAVENOUS
  Administered 2015-07-01 (×3): 0.25 mg via INTRAVENOUS

## 2015-07-01 MED ORDER — ROCURONIUM BROMIDE 100 MG/10ML IV SOLN
INTRAVENOUS | Status: DC | PRN
  Administered 2015-07-01: 40 mg via INTRAVENOUS
  Administered 2015-07-01 (×2): 10 mg via INTRAVENOUS

## 2015-07-01 MED ORDER — LIDOCAINE HCL (CARDIAC) 20 MG/ML IV SOLN
INTRAVENOUS | Status: DC | PRN
  Administered 2015-07-01: 80 mg via INTRAVENOUS

## 2015-07-01 MED ORDER — ATORVASTATIN CALCIUM 40 MG PO TABS
40.0000 mg | ORAL_TABLET | Freq: Every day | ORAL | Status: DC
  Administered 2015-07-01 – 2015-07-02 (×2): 40 mg via ORAL
  Filled 2015-07-01 (×2): qty 1

## 2015-07-01 MED ORDER — DIPHENHYDRAMINE HCL 12.5 MG/5ML PO ELIX: 12.5000 mg | ORAL_SOLUTION | Freq: Four times a day (QID) | ORAL | Status: DC | PRN

## 2015-07-01 MED ORDER — LACTATED RINGERS IR SOLN
Status: DC | PRN
  Administered 2015-07-01: 1000 mL

## 2015-07-01 MED ORDER — HEPARIN SODIUM (PORCINE) 1000 UNIT/ML IJ SOLN
INTRAMUSCULAR | Status: AC
  Filled 2015-07-01: qty 1

## 2015-07-01 MED ORDER — EPHEDRINE SULFATE 50 MG/ML IJ SOLN
INTRAMUSCULAR | Status: DC | PRN
  Administered 2015-07-01 (×3): 10 mg via INTRAVENOUS
  Administered 2015-07-01 (×2): 5 mg via INTRAVENOUS
  Administered 2015-07-01: 10 mg via INTRAVENOUS

## 2015-07-01 MED ORDER — BUPIVACAINE-EPINEPHRINE (PF) 0.25% -1:200000 IJ SOLN
INTRAMUSCULAR | Status: AC
  Filled 2015-07-01: qty 30

## 2015-07-01 MED ORDER — SULFAMETHOXAZOLE-TRIMETHOPRIM 800-160 MG PO TABS: 1.0000 | ORAL_TABLET | Freq: Two times a day (BID) | ORAL | Status: DC

## 2015-07-01 MED ORDER — PHENYLEPHRINE 40 MCG/ML (10ML) SYRINGE FOR IV PUSH (FOR BLOOD PRESSURE SUPPORT)
PREFILLED_SYRINGE | INTRAVENOUS | Status: AC
  Filled 2015-07-01: qty 10

## 2015-07-01 MED ORDER — CEFAZOLIN SODIUM-DEXTROSE 2-3 GM-% IV SOLR
2.0000 g | INTRAVENOUS | Status: AC
  Administered 2015-07-01: 2 g via INTRAVENOUS

## 2015-07-01 MED ORDER — PROPOFOL 10 MG/ML IV BOLUS
INTRAVENOUS | Status: DC | PRN
  Administered 2015-07-01: 180 mg via INTRAVENOUS

## 2015-07-01 MED ORDER — ROCURONIUM BROMIDE 100 MG/10ML IV SOLN
INTRAVENOUS | Status: AC
  Filled 2015-07-01: qty 1

## 2015-07-01 MED ORDER — PANTOPRAZOLE SODIUM 40 MG PO TBEC
80.0000 mg | DELAYED_RELEASE_TABLET | Freq: Every day | ORAL | Status: DC
  Administered 2015-07-01 – 2015-07-02 (×2): 80 mg via ORAL
  Filled 2015-07-01 (×2): qty 2

## 2015-07-01 MED ORDER — EPHEDRINE SULFATE 50 MG/ML IJ SOLN
INTRAMUSCULAR | Status: AC
  Filled 2015-07-01: qty 1

## 2015-07-01 MED ORDER — KETOROLAC TROMETHAMINE 30 MG/ML IJ SOLN
INTRAMUSCULAR | Status: AC
  Filled 2015-07-01: qty 1

## 2015-07-01 MED ORDER — CEFAZOLIN SODIUM 1-5 GM-% IV SOLN
1.0000 g | Freq: Three times a day (TID) | INTRAVENOUS | Status: AC
  Administered 2015-07-01 – 2015-07-02 (×2): 1 g via INTRAVENOUS
  Filled 2015-07-01 (×2): qty 50

## 2015-07-01 MED ORDER — LIDOCAINE HCL (CARDIAC) 20 MG/ML IV SOLN
INTRAVENOUS | Status: AC
  Filled 2015-07-01: qty 5

## 2015-07-01 MED ORDER — SODIUM CHLORIDE 0.9 % IR SOLN
Status: DC | PRN
  Administered 2015-07-01: 1000 mL

## 2015-07-01 MED ORDER — FENTANYL CITRATE (PF) 100 MCG/2ML IJ SOLN
INTRAMUSCULAR | Status: AC
  Filled 2015-07-01: qty 2

## 2015-07-01 SURGICAL SUPPLY — 52 items
APPLICATOR COTTON TIP 6IN STRL (MISCELLANEOUS) ×3 IMPLANT
CATH FOLEY 2WAY SLVR 18FR 30CC (CATHETERS) ×3 IMPLANT
CATH ROBINSON RED A/P 16FR (CATHETERS) ×3 IMPLANT
CATH ROBINSON RED A/P 8FR (CATHETERS) ×3 IMPLANT
CATH TIEMANN FOLEY 18FR 5CC (CATHETERS) ×3 IMPLANT
CHLORAPREP W/TINT 26ML (MISCELLANEOUS) ×3 IMPLANT
CLIP LIGATING HEM O LOK PURPLE (MISCELLANEOUS) ×6 IMPLANT
COVER SURGICAL LIGHT HANDLE (MISCELLANEOUS) ×3 IMPLANT
COVER TIP SHEARS 8 DVNC (MISCELLANEOUS) ×2 IMPLANT
COVER TIP SHEARS 8MM DA VINCI (MISCELLANEOUS) ×1
CUTTER ECHEON FLEX ENDO 45 340 (ENDOMECHANICALS) ×3 IMPLANT
DECANTER SPIKE VIAL GLASS SM (MISCELLANEOUS) ×3 IMPLANT
DRAPE ARM DVNC X/XI (DISPOSABLE) ×8 IMPLANT
DRAPE COLUMN DVNC XI (DISPOSABLE) ×2 IMPLANT
DRAPE DA VINCI XI ARM (DISPOSABLE) ×4
DRAPE DA VINCI XI COLUMN (DISPOSABLE) ×1
DRAPE SURG IRRIG POUCH 19X23 (DRAPES) ×3 IMPLANT
DRSG TEGADERM 4X4.75 (GAUZE/BANDAGES/DRESSINGS) ×3 IMPLANT
ELECT REM PT RETURN 9FT ADLT (ELECTROSURGICAL) ×3
ELECTRODE REM PT RTRN 9FT ADLT (ELECTROSURGICAL) ×2 IMPLANT
GLOVE BIO SURGEON STRL SZ 6.5 (GLOVE) ×3 IMPLANT
GLOVE BIOGEL M STRL SZ7.5 (GLOVE) ×6 IMPLANT
GOWN STRL REUS W/TWL LRG LVL3 (GOWN DISPOSABLE) ×9 IMPLANT
HOLDER FOLEY CATH W/STRAP (MISCELLANEOUS) ×3 IMPLANT
IV LACTATED RINGERS 1000ML (IV SOLUTION) ×3 IMPLANT
LIQUID BAND (GAUZE/BANDAGES/DRESSINGS) ×3 IMPLANT
NDL SAFETY ECLIPSE 18X1.5 (NEEDLE) ×2 IMPLANT
NEEDLE HYPO 18GX1.5 SHARP (NEEDLE) ×1
PACK ROBOT UROLOGY CUSTOM (CUSTOM PROCEDURE TRAY) ×3 IMPLANT
POSITIONER SURGICAL ARM (MISCELLANEOUS) ×3 IMPLANT
RELOAD GREEN ECHELON 45 (STAPLE) ×3 IMPLANT
SEAL CANN UNIV 5-8 DVNC XI (MISCELLANEOUS) ×8 IMPLANT
SEAL XI 5MM-8MM UNIVERSAL (MISCELLANEOUS) ×4
SET TUBE IRRIG SUCTION NO TIP (IRRIGATION / IRRIGATOR) ×3 IMPLANT
SOLUTION ELECTROLUBE (MISCELLANEOUS) ×3 IMPLANT
SUT ETHILON 3 0 PS 1 (SUTURE) ×3 IMPLANT
SUT MNCRL 3 0 RB1 (SUTURE) ×2 IMPLANT
SUT MNCRL 3 0 VIOLET RB1 (SUTURE) ×2 IMPLANT
SUT MNCRL AB 4-0 PS2 18 (SUTURE) ×6 IMPLANT
SUT MONOCRYL 3 0 RB1 (SUTURE) ×2
SUT VIC AB 0 CT1 27 (SUTURE) ×1
SUT VIC AB 0 CT1 27XBRD ANTBC (SUTURE) ×2 IMPLANT
SUT VIC AB 0 UR5 27 (SUTURE) ×3 IMPLANT
SUT VIC AB 2-0 SH 27 (SUTURE) ×1
SUT VIC AB 2-0 SH 27X BRD (SUTURE) ×2 IMPLANT
SUT VICRYL 0 UR6 27IN ABS (SUTURE) ×6 IMPLANT
SYR 27GX1/2 1ML LL SAFETY (SYRINGE) ×3 IMPLANT
SYRINGE 20CC LL (MISCELLANEOUS) ×3 IMPLANT
TOWEL OR 17X26 10 PK STRL BLUE (TOWEL DISPOSABLE) ×3 IMPLANT
TOWEL OR NON WOVEN STRL DISP B (DISPOSABLE) ×3 IMPLANT
TUBING INSUFFLATION 10FT LAP (TUBING) IMPLANT
WATER STERILE IRR 1500ML POUR (IV SOLUTION) ×3 IMPLANT

## 2015-07-01 NOTE — Anesthesia Procedure Notes (Signed)
Procedure Name: Intubation Date/Time: 07/01/2015 7:33 AM Performed by: West Pugh Pre-anesthesia Checklist: Patient identified, Emergency Drugs available, Suction available, Patient being monitored and Timeout performed Patient Re-evaluated:Patient Re-evaluated prior to inductionOxygen Delivery Method: Circle system utilized Preoxygenation: Pre-oxygenation with 100% oxygen Intubation Type: IV induction Ventilation: Mask ventilation without difficulty Laryngoscope Size: Mac and 4 Grade View: Grade II Tube type: Oral Tube size: 7.5 mm Airway Equipment and Method: Stylet Placement Confirmation: ETT inserted through vocal cords under direct vision,  positive ETCO2,  CO2 detector and breath sounds checked- equal and bilateral Secured at: 22 cm Tube secured with: Tape Dental Injury: Teeth and Oropharynx as per pre-operative assessment

## 2015-07-01 NOTE — Transfer of Care (Signed)
Immediate Anesthesia Transfer of Care Note  Patient: Jared Jacobs  Procedure(s) Performed: Procedure(s): XI ROBOTIC ASSISTED LAPAROSCOPIC RADICAL PROSTATECTOMY LEVEL 2 (N/A) BILATERAL PELVIC LYMPHADENECTOMY (Bilateral)  Patient Location: PACU  Anesthesia Type:General  Level of Consciousness:  sedated, patient cooperative and responds to stimulation  Airway & Oxygen Therapy:Patient Spontanous Breathing and Patient connected to face mask oxgen  Post-op Assessment:  Report given to PACU RN and Post -op Vital signs reviewed and stable  Post vital signs:  Reviewed and stable  Last Vitals:  Filed Vitals:   07/01/15 0516 07/01/15 1034  BP: 129/75 133/79  Pulse: 50 78  Temp: 36.4 C 36.4 C  Resp: 16 9    Complications: No apparent anesthesia complications

## 2015-07-01 NOTE — Discharge Instructions (Signed)

## 2015-07-01 NOTE — Anesthesia Preprocedure Evaluation (Addendum)
Anesthesia Evaluation  Patient identified by MRN, date of birth, ID band Patient awake    Reviewed: Allergy & Precautions, NPO status , Patient's Chart, lab work & pertinent test results  Airway Mallampati: II  TM Distance: >3 FB Neck ROM: Full    Dental no notable dental hx.    Pulmonary neg pulmonary ROS,    Pulmonary exam normal breath sounds clear to auscultation       Cardiovascular negative cardio ROS Normal cardiovascular exam Rhythm:Regular Rate:Normal     Neuro/Psych negative neurological ROS  negative psych ROS   GI/Hepatic Neg liver ROS, GERD  Medicated,  Endo/Other  negative endocrine ROS  Renal/GU negative Renal ROS  negative genitourinary   Musculoskeletal negative musculoskeletal ROS (+)   Abdominal   Peds negative pediatric ROS (+)  Hematology negative hematology ROS (+)   Anesthesia Other Findings   Reproductive/Obstetrics negative OB ROS                             Anesthesia Physical Anesthesia Plan  ASA: II  Anesthesia Plan: General   Post-op Pain Management:    Induction: Intravenous  Airway Management Planned: Oral ETT  Additional Equipment:   Intra-op Plan:   Post-operative Plan: Extubation in OR  Informed Consent: I have reviewed the patients History and Physical, chart, labs and discussed the procedure including the risks, benefits and alternatives for the proposed anesthesia with the patient or authorized representative who has indicated his/her understanding and acceptance.   Dental advisory given  Plan Discussed with: CRNA  Anesthesia Plan Comments:         Anesthesia Quick Evaluation

## 2015-07-01 NOTE — Anesthesia Postprocedure Evaluation (Signed)
Anesthesia Post Note  Patient: Jared Jacobs  Procedure(s) Performed: Procedure(s) (LRB): XI ROBOTIC ASSISTED LAPAROSCOPIC RADICAL PROSTATECTOMY LEVEL 2 (N/A) BILATERAL PELVIC LYMPHADENECTOMY (Bilateral)  Patient location during evaluation: PACU Anesthesia Type: General Level of consciousness: awake and alert Pain management: pain level controlled Vital Signs Assessment: post-procedure vital signs reviewed and stable Respiratory status: spontaneous breathing, nonlabored ventilation, respiratory function stable and patient connected to nasal cannula oxygen Cardiovascular status: blood pressure returned to baseline and stable Postop Assessment: no signs of nausea or vomiting Anesthetic complications: no    Last Vitals:  Filed Vitals:   07/01/15 1215 07/01/15 1230  BP: 132/84   Pulse: 82   Temp:  35.8 C  Resp: 16     Last Pain:  Filed Vitals:   07/01/15 1237  PainSc: Sandoval Hollis

## 2015-07-01 NOTE — Op Note (Signed)
Preoperative diagnosis: Clinically localized adenocarcinoma of the prostate (clinical stage T2b N0 M0)  Postoperative diagnosis: Clinically localized adenocarcinoma of the prostate (clinical stage T2b N0 M0)  Procedure:  1. Robotic assisted laparoscopic radical prostatectomy (bilateral nerve sparing) 2. Bilateral robotic assisted laparoscopic pelvic lymphadenectomy  Surgeon: Pryor Curia. M.D.  Assistant: Debbrah Alar, PA-C  Anesthesia: General  Complications: None  EBL: 100 mL  IVF:  1300 mL crystalloid  Specimens: 1. Prostate and seminal vesicles 2. Right pelvic lymph nodes 3. Left pelvic lymph nodes  Disposition of specimens: Pathology  Drains: 1. 20 Fr coude catheter 2. # 19 Blake pelvic drain  Indication: Jared Jacobs is a 75 y.o. year old patient with clinically localized prostate cancer.  After a thorough review of the management options for treatment of prostate cancer, he elected to proceed with surgical therapy and the above procedure(s).  We have discussed the potential benefits and risks of the procedure, side effects of the proposed treatment, the likelihood of the patient achieving the goals of the procedure, and any potential problems that might occur during the procedure or recuperation. Informed consent has been obtained.  Description of procedure:  The patient was taken to the operating room and a general anesthetic was administered. He was given preoperative antibiotics, placed in the dorsal lithotomy position, and prepped and draped in the usual sterile fashion. Next a preoperative timeout was performed. A urethral catheter was placed into the bladder and a site was selected near the umbilicus for placement of the camera port. This was placed using a standard open Hassan technique which allowed entry into the peritoneal cavity under direct vision and without difficulty. An 8 mm robotic port was placed and a pneumoperitoneum established. The camera  was then used to inspect the abdomen and there was no evidence of any intra-abdominal injuries or other abnormalities. The remaining abdominal ports were then placed. 8 mm robotic ports were placed in the right lower quadrant, left lower quadrant, and far left lateral abdominal wall. A 5 mm port was placed in the right upper quadrant and a 12 mm port was placed in the right lateral abdominal wall for laparoscopic assistance. All ports were placed under direct vision without difficulty. The surgical cart was then docked.   Utilizing the cautery scissors, the bladder was reflected posteriorly allowing entry into the space of Retzius and identification of the endopelvic fascia and prostate. The periprostatic fat was then removed from the prostate allowing full exposure of the endopelvic fascia. The endopelvic fascia was then incised from the apex back to the base of the prostate bilaterally and the underlying levator muscle fibers were swept laterally off the prostate thereby isolating the dorsal venous complex. The dorsal vein was then stapled and divided with a 45 mm Flex Echelon stapler. Attention then turned to the bladder neck which was divided anteriorly thereby allowing entry into the bladder and exposure of the urethral catheter. The catheter balloon was deflated and the catheter was brought into the operative field and used to retract the prostate anteriorly. The posterior bladder neck was then examined and was divided allowing further dissection between the bladder and prostate posteriorly until the vasa deferentia and seminal vessels were identified. The vasa deferentia were isolated, divided, and lifted anteriorly. The seminal vesicles were dissected down to their tips with care to control the seminal vascular arterial blood supply. These structures were then lifted anteriorly and the space between Denonvillier's fascia and the anterior rectum was developed with a  combination of sharp and blunt  dissection. This isolated the vascular pedicles of the prostate.  The lateral prostatic fascia was then sharply incised allowing release of the neurovascular bundles bilaterally. The vascular pedicles of the prostate were then ligated with Weck clips between the prostate and neurovascular bundles and divided with sharp cold scissor dissection resulting in neurovascular bundle preservation. The neurovascular bundles were then separated off the apex of the prostate and urethra bilaterally.  The urethra was then sharply transected allowing the prostate specimen to be disarticulated. The pelvis was copiously irrigated and hemostasis was ensured. There was no evidence for rectal injury.  Attention then turned to the right pelvic sidewall. The fibrofatty tissue between the external iliac vein, confluence of the iliac vessels, hypogastric artery, and Cooper's ligament was dissected free from the pelvic sidewall with care to preserve the obturator nerve. Weck clips were used for lymphostasis and hemostasis. An identical procedure was performed on the contralateral side and the lymphatic packets were removed for permanent pathologic analysis.  Attention then turned to the urethral anastomosis. A 2-0 Vicryl slip knot was placed between Denonvillier's fascia, the posterior bladder neck, and the posterior urethra to reapproximate these structures. A double-armed 3-0 Monocryl suture was then used to perform a 360 running tension-free anastomosis between the bladder neck and urethra. A new urethral catheter was then placed into the bladder and irrigated. There were no blood clots within the bladder and the anastomosis appeared to be watertight. A #19 Blake drain was then brought through the left lateral 8 mm port site and positioned appropriately within the pelvis. It was secured to the skin with a nylon suture. The surgical cart was then undocked. The right lateral 12 mm port site was closed at the fascial level with a  0 Vicryl suture placed laparoscopically. All remaining ports were then removed under direct vision. The prostate specimen was removed intact within the Endopouch retrieval bag via the periumbilical camera port site. This fascial opening was closed with two running 0 Vicryl sutures. 0.25% Marcaine was then injected into all port sites and all incisions were reapproximated at the skin level with 4-0 Monocryl subcuticular sutures and Dermabond. The patient appeared to tolerate the procedure well and without complications. The patient was able to be extubated and transferred to the recovery unit in satisfactory condition.   Pryor Curia MD

## 2015-07-01 NOTE — Progress Notes (Signed)
Post-op note  Subjective: The patient is doing well.  No complaints.  Objective: Vital signs in last 24 hours: Temp:  [96.4 F (35.8 C)-97.6 F (36.4 C)] 97.6 F (36.4 C) (03/16 1257) Pulse Rate:  [50-85] 85 (03/16 1257) Resp:  [7-19] 14 (03/16 1257) BP: (129-148)/(71-86) 136/71 mmHg (03/16 1257) SpO2:  [94 %-100 %] 94 % (03/16 1257) Weight:  [80.74 kg (178 lb)] 80.74 kg (178 lb) (03/16 0536)  Intake/Output from previous day:   Intake/Output this shift: Total I/O In: 1800 [I.V.:1800] Out: 810 [Urine:710; Blood:100]  Physical Exam:  General: Alert and oriented. Abdomen: Soft, Nondistended. Incisions: Clean and dry. Urine: red  Lab Results:  Recent Labs  06/29/15 1415 07/01/15 1049  HGB 14.3 12.5*  HCT 44.2 38.8*    Assessment/Plan: POD#0   1) Continue to monitor  2) DVT prophy, clears, IS, amb, pain control   LOS: 0 days   Jared Jacobs 07/01/2015, 2:20 PM

## 2015-07-02 LAB — HEMOGLOBIN AND HEMATOCRIT, BLOOD
HEMATOCRIT: 32.8 % — AB (ref 39.0–52.0)
Hemoglobin: 11.1 g/dL — ABNORMAL LOW (ref 13.0–17.0)

## 2015-07-02 MED ORDER — HYDROCODONE-ACETAMINOPHEN 5-325 MG PO TABS: 1.0000 | ORAL_TABLET | Freq: Four times a day (QID) | ORAL | Status: DC | PRN

## 2015-07-02 MED ORDER — BISACODYL 10 MG RE SUPP
10.0000 mg | Freq: Once | RECTAL | Status: AC
  Administered 2015-07-02: 10 mg via RECTAL
  Filled 2015-07-02: qty 1

## 2015-07-02 NOTE — Progress Notes (Signed)
Patient ID: Jared Jacobs, male   DOB: [birth year], 75 y.o.   MRN: [id number]  1 Day Post-Op Subjective: The patient is doing well.  No nausea or vomiting. Pain is adequately controlled.  Objective: Vital signs in last 24 hours: Temp:  [96.4 F (35.8 C)-98.4 F (36.9 C)] 98.4 F (36.9 C) (03/17 0247) Pulse Rate:  [77-104] 86 (03/17 0247) Resp:  [7-19] 18 (03/17 0247) BP: (118-148)/(68-89) 118/68 mmHg (03/17 0247) SpO2:  [93 %-100 %] 93 % (03/17 0247)  Intake/Output from previous day: 03/16 0701 - 03/17 0700 In: 3512.5 [P.O.:480; I.V.:2982.5; IV Piggyback:50] Out: [id number] [Urine:3210; Drains:105; Blood:100] Intake/Output this shift:    Physical Exam:  General: Alert and oriented. CV: RRR Lungs: Clear bilaterally. GI: Soft, Nondistended. Incisions: Clean, dry, and intact Urine: Clear Extremities: Nontender, no erythema, no edema.  Lab Results:  Recent Labs  06/29/15 1415 07/01/15 1049 07/02/15 0430  HGB 14.3 12.5* 11.1*  HCT 44.2 38.8* 32.8*      Assessment/Plan: POD# 1 s/p robotic prostatectomy.  1) SL IVF 2) Ambulate, Incentive spirometry 3) Transition to oral pain medication 4) Dulcolax suppository 5) D/C pelvic drain 6) Plan for likely discharge later today   Pryor Curia. MD   LOS: 1 day   Taura Lamarre,LES 07/02/2015, 7:28 AM

## 2015-07-02 NOTE — Discharge Summary (Signed)
  Date of admission: 07/01/2015  Date of discharge: 07/02/2015  Admission diagnosis: Prostate Cancer  Discharge diagnosis: Prostate Cancer  History and Physical: For full details, please see admission history and physical. Briefly, Jared Jacobs is a 75 y.o. gentleman with localized prostate cancer.  After discussing management/treatment options, he elected to proceed with surgical treatment.  Hospital Course: Jared Jacobs was taken to the operating room on 07/01/2015 and underwent a robotic assisted laparoscopic radical prostatectomy. He tolerated this procedure well and without complications. Postoperatively, he was able to be transferred to a regular hospital room following recovery from anesthesia.  He was able to begin ambulating the night of surgery. He remained hemodynamically stable overnight.  He had excellent urine output with appropriately minimal output from his pelvic drain and his pelvic drain was removed on POD #1.  He was transitioned to oral pain medication, tolerated a clear liquid diet, and had met all discharge criteria and was able to be discharged home later on POD#1.  Laboratory values:  Recent Labs  06/29/15 1415 07/01/15 1049 07/02/15 0430  HGB 14.3 12.5* 11.1*  HCT 44.2 38.8* 32.8*    Disposition: Home  Discharge instruction: He was instructed to be ambulatory but to refrain from heavy lifting, strenuous activity, or driving. He was instructed on urethral catheter care.  Discharge medications:     Medication List    TAKE these medications        amitriptyline 75 MG tablet  Commonly known as:  ELAVIL  Take 75 mg by mouth at bedtime.     atorvastatin 40 MG tablet  Commonly known as:  LIPITOR  Take 40 mg by mouth daily.     esomeprazole 40 MG capsule  Commonly known as:  NEXIUM  Take 40 mg by mouth daily at 12 noon.     HYDROcodone-acetaminophen 5-325 MG tablet  Commonly known as:  NORCO  Take 1-2 tablets by mouth every 6 (six) hours as needed.      sulfamethoxazole-trimethoprim 800-160 MG tablet  Commonly known as:  BACTRIM DS,SEPTRA DS  Take 1 tablet by mouth 2 (two) times daily. Start the day prior to foley removal appointment        Followup: He will followup in 1 week for catheter removal and to discuss his surgical pathology results.

## 2015-07-13 DIAGNOSIS — F418 Other specified anxiety disorders: Secondary | ICD-10-CM | POA: Diagnosis not present

## 2015-07-13 DIAGNOSIS — C61 Malignant neoplasm of prostate: Secondary | ICD-10-CM | POA: Diagnosis not present

## 2015-07-29 DIAGNOSIS — N393 Stress incontinence (female) (male): Secondary | ICD-10-CM | POA: Diagnosis not present

## 2015-07-29 DIAGNOSIS — M6281 Muscle weakness (generalized): Secondary | ICD-10-CM | POA: Diagnosis not present

## 2015-07-29 DIAGNOSIS — N3010 Interstitial cystitis (chronic) without hematuria: Secondary | ICD-10-CM | POA: Diagnosis not present

## 2015-07-29 DIAGNOSIS — R278 Other lack of coordination: Secondary | ICD-10-CM | POA: Diagnosis not present

## 2015-08-04 DIAGNOSIS — C61 Malignant neoplasm of prostate: Secondary | ICD-10-CM | POA: Diagnosis not present

## 2015-08-04 DIAGNOSIS — C775 Secondary and unspecified malignant neoplasm of intrapelvic lymph nodes: Secondary | ICD-10-CM | POA: Diagnosis not present

## 2015-08-20 DIAGNOSIS — N393 Stress incontinence (female) (male): Secondary | ICD-10-CM | POA: Diagnosis not present

## 2015-08-20 DIAGNOSIS — R278 Other lack of coordination: Secondary | ICD-10-CM | POA: Diagnosis not present

## 2015-08-20 DIAGNOSIS — N3010 Interstitial cystitis (chronic) without hematuria: Secondary | ICD-10-CM | POA: Diagnosis not present

## 2015-08-20 DIAGNOSIS — C61 Malignant neoplasm of prostate: Secondary | ICD-10-CM | POA: Diagnosis not present

## 2015-08-20 DIAGNOSIS — M6281 Muscle weakness (generalized): Secondary | ICD-10-CM | POA: Diagnosis not present

## 2015-08-27 DIAGNOSIS — C61 Malignant neoplasm of prostate: Secondary | ICD-10-CM | POA: Diagnosis not present

## 2015-08-27 DIAGNOSIS — Z0000 Encounter for general adult medical examination without abnormal findings: Secondary | ICD-10-CM | POA: Diagnosis not present

## 2015-09-06 DIAGNOSIS — C61 Malignant neoplasm of prostate: Secondary | ICD-10-CM | POA: Diagnosis not present

## 2015-09-07 ENCOUNTER — Other Ambulatory Visit (HOSPITAL_COMMUNITY): Payer: Self-pay | Admitting: Family Medicine

## 2015-09-07 DIAGNOSIS — C61 Malignant neoplasm of prostate: Secondary | ICD-10-CM

## 2015-09-16 ENCOUNTER — Ambulatory Visit (HOSPITAL_COMMUNITY): Payer: Medicare Other

## 2015-09-16 ENCOUNTER — Encounter (HOSPITAL_COMMUNITY): Payer: Medicare Other

## 2015-09-20 DIAGNOSIS — C61 Malignant neoplasm of prostate: Secondary | ICD-10-CM | POA: Diagnosis not present

## 2015-09-20 DIAGNOSIS — C771 Secondary and unspecified malignant neoplasm of intrathoracic lymph nodes: Secondary | ICD-10-CM | POA: Diagnosis not present

## 2015-09-21 DIAGNOSIS — E7800 Pure hypercholesterolemia, unspecified: Secondary | ICD-10-CM | POA: Diagnosis not present

## 2015-09-21 DIAGNOSIS — K219 Gastro-esophageal reflux disease without esophagitis: Secondary | ICD-10-CM | POA: Diagnosis not present

## 2015-09-21 DIAGNOSIS — C61 Malignant neoplasm of prostate: Secondary | ICD-10-CM | POA: Diagnosis not present

## 2015-09-21 DIAGNOSIS — F5102 Adjustment insomnia: Secondary | ICD-10-CM | POA: Diagnosis not present

## 2015-09-23 ENCOUNTER — Encounter (HOSPITAL_COMMUNITY)
Admission: RE | Admit: 2015-09-23 | Discharge: 2015-09-23 | Disposition: A | Discharge status: Home / Self Care | Payer: Medicare Other | Source: Ambulatory Visit | Attending: Family Medicine | Admitting: Family Medicine

## 2015-09-23 ENCOUNTER — Ambulatory Visit (HOSPITAL_COMMUNITY)
Admission: RE | Admit: 2015-09-23 | Discharge: 2015-09-23 | Disposition: A | Discharge status: Home / Self Care | Payer: Medicare Other | Source: Ambulatory Visit | Attending: Family Medicine | Admitting: Family Medicine

## 2015-09-23 ENCOUNTER — Encounter (HOSPITAL_COMMUNITY): Payer: Self-pay

## 2015-09-23 DIAGNOSIS — C61 Malignant neoplasm of prostate: Secondary | ICD-10-CM

## 2015-09-23 DIAGNOSIS — M19071 Primary osteoarthritis, right ankle and foot: Secondary | ICD-10-CM | POA: Diagnosis not present

## 2015-09-23 DIAGNOSIS — Z8546 Personal history of malignant neoplasm of prostate: Secondary | ICD-10-CM | POA: Diagnosis not present

## 2015-09-23 DIAGNOSIS — N200 Calculus of kidney: Secondary | ICD-10-CM | POA: Diagnosis not present

## 2015-09-23 DIAGNOSIS — K5730 Diverticulosis of large intestine without perforation or abscess without bleeding: Secondary | ICD-10-CM | POA: Diagnosis not present

## 2015-09-23 DIAGNOSIS — R935 Abnormal findings on diagnostic imaging of other abdominal regions, including retroperitoneum: Secondary | ICD-10-CM | POA: Diagnosis not present

## 2015-09-23 DIAGNOSIS — M19031 Primary osteoarthritis, right wrist: Secondary | ICD-10-CM | POA: Insufficient documentation

## 2015-09-23 MED ORDER — IOPAMIDOL (ISOVUE-300) INJECTION 61%
100.0000 mL | Freq: Once | INTRAVENOUS | Status: AC | PRN
  Administered 2015-09-23: 100 mL via INTRAVENOUS

## 2015-09-23 MED ORDER — TECHNETIUM TC 99M MEDRONATE IV KIT: 25.0000 | PACK | Freq: Once | INTRAVENOUS | Status: DC | PRN

## 2015-09-23 MED ORDER — DIATRIZOATE MEGLUMINE & SODIUM 66-10 % PO SOLN
30.0000 mL | Freq: Once | ORAL | Status: AC
  Administered 2015-09-23: 30 mL via ORAL

## 2015-09-27 DIAGNOSIS — C61 Malignant neoplasm of prostate: Secondary | ICD-10-CM | POA: Diagnosis not present

## 2015-09-27 DIAGNOSIS — Z006 Encounter for examination for normal comparison and control in clinical research program: Secondary | ICD-10-CM | POA: Diagnosis not present

## 2015-10-04 DIAGNOSIS — C61 Malignant neoplasm of prostate: Secondary | ICD-10-CM | POA: Diagnosis not present

## 2015-10-14 DIAGNOSIS — C61 Malignant neoplasm of prostate: Secondary | ICD-10-CM | POA: Diagnosis not present

## 2015-10-15 DIAGNOSIS — C61 Malignant neoplasm of prostate: Secondary | ICD-10-CM | POA: Diagnosis not present

## 2015-10-18 DIAGNOSIS — C61 Malignant neoplasm of prostate: Secondary | ICD-10-CM | POA: Diagnosis not present

## 2015-10-18 DIAGNOSIS — Z006 Encounter for examination for normal comparison and control in clinical research program: Secondary | ICD-10-CM | POA: Diagnosis not present

## 2015-10-20 ENCOUNTER — Encounter: Payer: Self-pay | Admitting: Medical Oncology

## 2015-10-20 ENCOUNTER — Telehealth: Payer: Self-pay | Admitting: Medical Oncology

## 2015-10-20 DIAGNOSIS — C61 Malignant neoplasm of prostate: Secondary | ICD-10-CM | POA: Diagnosis not present

## 2015-10-20 NOTE — Telephone Encounter (Signed)
Oncology Nurse Navigator Documentation  Oncology Nurse Navigator Flowsheets 10/20/2015  Navigator Location [facility]  Navigator Encounter Type Introductory phone call  Abnormal Finding Date 08/20/2015  Surgery Date 07/01/2015  Barriers/Navigation Needs No barriers at this time  Interventions None required  Support Groups/Services Friends and Family  Acuity Level 1  Acuity Level 1 Initial guidance, education and coordination as needed  Time Spent with Patient 30   I called pt to introduce myself as the Prostate Nurse Navigator and the Coordinator of the Prostate [facility]renelefe.  1. I confirmed with the patient he is aware of his referral to the clinic  July 14 th arriving at 7:30 am.  2. I discussed the format of the clinic and the physicians he will be seeing that day. He informed me that he is currently enrolled in a clinical trial at [facility]. He has received one injection of ADT and has completed his 2nd cycle of Taxotere. He has tolerated it well but get fatigued after the treatment.   3. I discussed where the clinic is located and how to contact me.  4. I confirmed his address and informed him I would be mailing a packet of information and forms to be completed. I asked him to bring them with him the day of his appointment.   He voiced understanding of the above. I asked him to call me if he has any questions or concerns regarding his appointments or the forms he needs to complete.

## 2015-10-25 DIAGNOSIS — C61 Malignant neoplasm of prostate: Secondary | ICD-10-CM | POA: Diagnosis not present

## 2015-10-28 ENCOUNTER — Encounter: Payer: Self-pay | Admitting: Radiation Oncology

## 2015-10-28 ENCOUNTER — Telehealth: Payer: Self-pay | Admitting: Medical Oncology

## 2015-10-28 NOTE — Telephone Encounter (Signed)
Oncology Nurse Navigator Documentation  Oncology Nurse Navigator Flowsheets 10/20/2015 10/28/2015  Navigator Location [facility] -  Navigator Encounter Type Introductory phone call Telephone  Telephone - Outgoing Call;Appt Confirmation/Clarification- Jared Jacobs confirmed he will be here for appointment in am. He states he did receive the packet of information and will bring the completed forms.   Abnormal Finding Date 08/20/2015 -  Surgery Date 07/01/2015 -  Barriers/Navigation Needs No barriers at this time -  Interventions None required -  Support Groups/Services Friends and Family -  Acuity Level 1 -  Acuity Level 1 Initial guidance, education and coordination as needed -  Time Spent with Patient 30 15

## 2015-10-28 NOTE — Progress Notes (Signed)
GU Location of Tumor / Histology: prostatic adenocarcinoma  If Prostate Cancer, Gleason Score is (4 + 5) and PSA is (9.81)pre treatment with a positive surgical margin at the bladder neck.     Past/Anticipated interventions by urology, if any: s/p a BNS RAL radical prostatectomy and BPLND on 07/01/15  Past/Anticipated interventions by medical oncology, if any: clinical trial at [facility]  Weight changes, if any: no  Bowel/Bladder complaints, if any: incontinence following surgery has resolved. ED is a low priority for the patient   Nausea/Vomiting, if any: no  Pain issues, if any:  no  SAFETY ISSUES:  Prior radiation? no  Pacemaker/ICD? no  Possible current pregnancy? no  Is the patient on methotrexate? no  Current Complaints / other details:  75 year old male. Single. Consultatnt.

## 2015-10-29 ENCOUNTER — Ambulatory Visit (HOSPITAL_BASED_OUTPATIENT_CLINIC_OR_DEPARTMENT_OTHER): Payer: Medicare Other | Admitting: Oncology

## 2015-10-29 ENCOUNTER — Encounter: Payer: Self-pay | Admitting: Medical Oncology

## 2015-10-29 ENCOUNTER — Ambulatory Visit
Admission: RE | Admit: 2015-10-29 | Discharge: 2015-10-29 | Disposition: A | Discharge status: Home / Self Care | Payer: Medicare Other | Source: Ambulatory Visit | Attending: Radiation Oncology | Admitting: Radiation Oncology

## 2015-10-29 VITALS — BP 179/108 | HR 99 | Resp 16 | Wt 178.2 lb

## 2015-10-29 DIAGNOSIS — N3010 Interstitial cystitis (chronic) without hematuria: Secondary | ICD-10-CM | POA: Diagnosis not present

## 2015-10-29 DIAGNOSIS — E291 Testicular hypofunction: Secondary | ICD-10-CM | POA: Diagnosis not present

## 2015-10-29 DIAGNOSIS — K219 Gastro-esophageal reflux disease without esophagitis: Secondary | ICD-10-CM | POA: Diagnosis not present

## 2015-10-29 DIAGNOSIS — Z7982 Long term (current) use of aspirin: Secondary | ICD-10-CM | POA: Insufficient documentation

## 2015-10-29 DIAGNOSIS — C61 Malignant neoplasm of prostate: Secondary | ICD-10-CM

## 2015-10-29 DIAGNOSIS — Z87442 Personal history of urinary calculi: Secondary | ICD-10-CM | POA: Insufficient documentation

## 2015-10-29 DIAGNOSIS — Z85828 Personal history of other malignant neoplasm of skin: Secondary | ICD-10-CM | POA: Insufficient documentation

## 2015-10-29 DIAGNOSIS — E785 Hyperlipidemia, unspecified: Secondary | ICD-10-CM | POA: Diagnosis not present

## 2015-10-29 NOTE — Progress Notes (Signed)
Radiation Oncology         ([phone]) [phone] ________________________________  Initial outpatient Consultation  Name: Jared Jacobs MRN: [id number]  Date: 10/29/2015  DOB: 01/02/1941  [id number] A, MD  [name], MD   REFERRING PHYSICIAN: [name], MD  DIAGNOSIS: The encounter diagnosis was Prostate cancer [facility]).    ICD-9-CM ICD-10-CM   1. Prostate cancer ([place]) 185 C61     HISTORY OF PRESENT ILLNESS: Jared Jacobs is a 75 y.o. male with a new diagnosis of prostate cancer. He was originally found to have an adenocarcinoma of the prostate and transrectal ultrasound with 12 biopsies of the prostate on 04/20/2015 reveleaed a the maximum Gleason score was 4+3, and this was seen in the right base and right mid. As well as Gleason score 3+4, and this was seen in the left base lateral, left apex lateral, left base, left mid, right apex, and right mid lateral. He did have a fluctuating PSA and was found to have a PSA of 9.81. A metastatic work up was negative radiographically.  He underwent a radical prostatectomy on 07/01/2015 which revealed Gleason 4+5 with extraprostatic extension identified bilateral anterior at the mid, bladder neck and bilateral posterior at the base. Lymphovascular invasion was also identified. Seminal vesicles, vasa deferentia, and other surgical margins were negative for tumor. Three of sixteen lymph nodes were positive.  His post prostatectomy PSA was 0.02 in March 2017. He sought second opinion at [facility] and is enrolled in a clinical trial and is currently receiving taxotere and trelstar. He is due to receive cycle three of taxotere on 11/15/15, and Trelstar which is due 12/28/15. He comes today to meet with Dr. Tammi Klippel to discuss the role of radiotherapy. His most recent PSA was <0.1 on 10/15/15, 0.02 on 08/21/15.    PREVIOUS RADIATION THERAPY: No  PAST MEDICAL HISTORY:  Past Medical History  Diagnosis Date  . Hyperlipidemia   . History of skin  cancer   . GERD (gastroesophageal reflux disease)   . IC (interstitial cystitis)   . History of kidney stones   . Prostate cancer [facility])       PAST SURGICAL HISTORY: Past Surgical History  Procedure Laterality Date  . No past surgeries    . Robot assisted laparoscopic radical prostatectomy N/A 07/01/2015    Procedure: XI ROBOTIC ASSISTED LAPAROSCOPIC RADICAL PROSTATECTOMY LEVEL 2;  Surgeon: [name], MD;  Location: [facility];  Service: Urology;  Laterality: N/A;  . Lymphadenectomy Bilateral 07/01/2015    Procedure: BILATERAL PELVIC LYMPHADENECTOMY;  Surgeon: [name], MD;  Location: [facility];  Service: Urology;  Laterality: Bilateral;    FAMILY HISTORY:  Family History  Problem Relation Age of Onset  . Cancer Mother     kidney  . Cancer Father     colon  . Cancer Brother     brain    SOCIAL HISTORY:  Social History   Social History  . Marital Status: Single    Spouse Name: N/A  . Number of Children: N/A  . Years of Education: N/A   Occupational History  . Not on file.   Social History Main Topics  . Smoking status: Never Smoker   . Smokeless tobacco: Never Used  . Alcohol Use: Yes     Comment: occasional  . Drug Use: No  . Sexual Activity: No   Other Topics Concern  . Not on file   Social History Narrative    ALLERGIES: Review of patient's allergies indicates no known allergies.  MEDICATIONS:  Current Outpatient Prescriptions  Medication Sig Dispense Refill  . amitriptyline (ELAVIL) 75 MG tablet Take 75 mg by mouth.    Marland Kitchen atorvastatin (LIPITOR) 40 MG tablet Take 40 mg by mouth.    . dexamethasone (DECADRON) 4 MG tablet     . LORazepam (ATIVAN) 2 MG tablet     . pantoprazole (PROTONIX) 40 MG tablet Take 40 mg by mouth.    . prochlorperazine (COMPAZINE) 10 MG tablet Take by mouth.    . tadalafil (CIALIS) 5 MG tablet Take 5 mg by mouth.    . zolpidem (AMBIEN) 10 MG tablet Take 10 mg by mouth.    Marland Kitchen aspirin EC 81 MG tablet Take 81 mg by mouth.    .  esomeprazole (NEXIUM) 40 MG capsule Take by mouth.    Marland Kitchen HYDROcodone-acetaminophen (NORCO) 5-325 MG tablet Take 1-2 tablets by mouth every 6 (six) hours as needed. 30 tablet 0  . sulfamethoxazole-trimethoprim (BACTRIM DS,SEPTRA DS) 800-160 MG tablet Take 1 tablet by mouth 2 (two) times daily. Start the day prior to foley removal appointment 6 tablet 0   No current facility-administered medications for this encounter.    REVIEW OF SYSTEMS:  On review of systems, the patient reports that he is doing well overall. He denies any chest pain, shortness of breath, cough, fevers, chills, night sweats, unintended weight changes. He denies any bowel disturbances, and denies abdominal pain, nausea or vomiting. He denies any new musculoskeletal or joint aches or pains. He describes his urinary symptoms as notable for nocturia x1, mild frequency, urgency, intermittency, and weak stream. His IPSS score is 5 indicated mild. He is not confident in his ability to maintain an erection for satisfactory intercourse. A complete review of systems is obtained and is otherwise negative.    PHYSICAL EXAM:  weight is 178 lb 3.2 oz (80.831 kg). His blood pressure is 179/108 and his pulse is 99. His respiration is 16 and oxygen saturation is 100%.   Pain Scale 0/10 In general this is a well appearing He is in no acute distress. He is alert and oriented x4 and appropriate throughout the examination. HEENT reveals that the patient is normocephalic, atraumatic. EOMs are intact. PERRLA. Skin is intact without any evidence of gross lesions. Cardiovascular exam reveals a regular rate and rhythm, no clicks rubs or murmurs are auscultated. Chest is clear to auscultation bilaterally. Lymphatic assessment is performed and does not reveal any adenopathy in the cervical, supraclavicular, axillary, or inguinal chains. Abdomen has active bowel sounds in all quadrants and is intact. The abdomen is soft, non tender, non distended. Lower extremities  are negative for pretibial pitting edema, deep calf tenderness, cyanosis or clubbing.   KPS = 100  100 - Normal; no complaints; no evidence of disease. 90   - Able to carry on normal activity; minor signs or symptoms of disease. 80   - Normal activity with effort; some signs or symptoms of disease. 44   - Cares for self; unable to carry on normal activity or to do active work. 60   - Requires occasional assistance, but is able to care for most of his personal needs. 50   - Requires considerable assistance and frequent medical care. 20   - Disabled; requires special care and assistance. 60   - Severely disabled; hospital admission is indicated although death not imminent. 42   - Very sick; hospital admission necessary; active supportive treatment necessary. 10   - Moribund; fatal processes progressing rapidly. 0     -  Dead  Karnofsky DA, Abelmann Colona, Craver LS and Burchenal [facility] [phone]) The use of the nitrogen mustards in the palliative treatment of carcinoma: with particular reference to bronchogenic carcinoma Cancer 1 634-56  LABORATORY DATA:  Lab Results  Component Value Date   WBC 6.0 06/29/2015   HGB 11.1* 07/02/2015   HCT 32.8* 07/02/2015   MCV 97.1 06/29/2015   PLT 255 06/29/2015   Lab Results  Component Value Date   NA 138 06/29/2015   K 3.7 06/29/2015   CL 103 06/29/2015   CO2 26 06/29/2015   No results found for: ALT, AST, GGT, ALKPHOS, BILITOT   RADIOGRAPHY: No results found.    IMPRESSION: Mr. Massaquoi is a pleasant 76 year-old gentleman with prostatic adenocarcinoma, Gleason Score 4+5, and PSA 9.81 with a positive surgical margin at the bladder neck, post-prostatectomy.  Accordingly, the patient is an ideal candidate for treatment options including external beam radiation and androgen deprivation therapy.  PLAN: Today I reviewed the findings and workup thus far.  We discussed the natural history of prostate cancer.  We reviewed the implications of T-stage, Gleason's  Score, and PSA on decision-making and outcomes in prostate cancer.  We discussed radiation treatment in the management of prostate cancer with regard to the logistics and delivery of external beam radiation treatment.  The patient expressed interest in external beam radiotherapy.  I filled out a patient counseling form for him with relevant treatment diagrams and we retained a copy for our records.   The patient would like to proceed with prostatic fossa IMRT.  I will share my findings with Dr. [name] and move forward with scheduling placement of three gold fiducial markers into the prostate to proceed with IMRT in the near future. The patient will return for CT simulation 4-6 weeks after finishing chemotherapy treatment at [facility]. His last chemotherapy treatment is scheduled for mid-August.   I enjoyed meeting with him today, and will look forward to participating in the care of this very nice gentleman.   The above documentation reflects my direct findings during this shared patient visit. Please see the separate note by Dr. Tammi Klippel on this date for the remainder of the patient's plan of care.   Carola Rhine, PAC   This document serves as a record of services personally performed by Tyler Pita, MD and Shona Simpson, PAC. It was created on his behalf by Arlyce Harman, a trained medical scribe. The creation of this record is based on the scribe's personal observations and the provider's statements to them. This document has been checked and approved by the attending provider.

## 2015-10-29 NOTE — Progress Notes (Signed)
Oncology Nurse Navigator Documentation  Oncology Nurse Navigator Flowsheets 10/20/2015 10/28/2015 10/29/2015  Navigator Location [facility] - -  Navigator Encounter Type Introductory phone call Telephone Clinic/MDC  Telephone - Outgoing Call;Appt Confirmation/Clarification -  Abnormal Finding Date 08/20/2015 - -  Surgery Date 07/01/2015 - -  Patient Visit Type - - Initial  Treatment Phase - - Pre-Tx/Tx Discussion- Jared Jacobs is currently enrolled in a clinical trial at [facility], He will receive radiation here at [facility] in September with Dr. Tammi Klippel  Barriers/Navigation Needs No barriers at this time - No barriers at this time  Interventions None required - None required  Support Groups/Services Friends and Family - Friends and Family  Acuity Level 1 - -  Acuity Level 1 Initial guidance, education and coordination as needed - -  Time Spent with Patient 30 15 > 120                                 Care Plan Summary  Name: Mr. [name] DOB: Jul 20, 1940   Your Medical Team:   Urologist -  Dr. [name], [facility]  Radiation Oncologist - Dr.Matthew Micki [facility]   Medical Oncologist - Dr. Zola Button, [place]  Recommendations: 1) Continue treatments per research protocol you currently are enrolled in  at [facility]    * These recommendations are based on information available as of today's consult.    Recommendations may change depending on the results of further tests or exams.  Next Steps: 1) You will continue to receive chemotherapy at [facility] per research protocol that you are enrolled in.   2) You will be scheduled for follow up appointment with Dr. Tammi Klippel in late August  When appointments need to be scheduled, you will be contacted by [facility] and/or [facility].  Questions?  Please do not hesitate to call Cira Rue, RN, BSN, OCN at [phone] any questions or concerns.   Shirlean Mylar is your Oncology Nurse Navigator and is available to assist you while you're receiving your medical care at [facility].Marland KitchenGL

## 2015-10-29 NOTE — Progress Notes (Signed)
Reason for Referral: Prostate cancer.   HPI: 75 year old gentleman currently of [place] where he lived recently. He lived in multiple locations and was involved in the [employer]. He is a gentleman with excellent health and shape diagnosed with prostate cancer in January 2017. At that time he had a PSA of 9.8 and a Gleason score 4+5 = 9. He subsequently underwent a radical prostatectomy and bilateral lymph node dissection on 07/01/2015. The pathology showed a prostatic adenocarcinoma Gleason score 4+5 = 9 with extraprostatic extension is identified. Lymphovascular invasion was identified with positive margins. 2 out of 10 lymph nodes were involved with metastatic disease. Patient recovered fairly well from his operation and involved currently in an adjuvant clinical trial utilizing androgen deprivation therapy plus Taxotere chemotherapy at [facility]. He has received androgen deprivation in the form of Trelstar and 2 cycles of Taxotere chemotherapy. He has tolerated therapy well without any complications. He denied any nausea or vomiting. Did not peripheral neuropathy. He continues to have excellent performance status and activity level.  He does not report any headaches, blurry vision, syncope or seizures. He does not report any fevers or chills or sweats. He does not report any cough, wheezing or hemoptysis. Does not report any nausea, vomiting or abdominal pain. He does not report any frequency urgency or hesitancy. Does not report any skeletal complaints. Remaining review of systems unremarkable.   Past Medical History  Diagnosis Date  . Hyperlipidemia   . History of skin cancer   . GERD (gastroesophageal reflux disease)   . IC (interstitial cystitis)   . History of kidney stones   . Prostate cancer [facility])   :  Past Surgical History  Procedure Laterality Date  . No past surgeries    . Robot assisted laparoscopic radical prostatectomy N/A 07/01/2015    Procedure: XI ROBOTIC  ASSISTED LAPAROSCOPIC RADICAL PROSTATECTOMY LEVEL 2;  Surgeon: [name], MD;  Location: [facility];  Service: Urology;  Laterality: N/A;  . Lymphadenectomy Bilateral 07/01/2015    Procedure: BILATERAL PELVIC LYMPHADENECTOMY;  Surgeon: [name], MD;  Location: [facility];  Service: Urology;  Laterality: Bilateral;  :   Current outpatient prescriptions:  .  amitriptyline (ELAVIL) 75 MG tablet, Take 75 mg by mouth., Disp: , Rfl:  .  aspirin EC 81 MG tablet, Take 81 mg by mouth., Disp: , Rfl:  .  atorvastatin (LIPITOR) 40 MG tablet, Take 40 mg by mouth., Disp: , Rfl:  .  dexamethasone (DECADRON) 4 MG tablet, , Disp: , Rfl:  .  esomeprazole (NEXIUM) 40 MG capsule, Take by mouth., Disp: , Rfl:  .  HYDROcodone-acetaminophen (NORCO) 5-325 MG tablet, Take 1-2 tablets by mouth every 6 (six) hours as needed., Disp: 30 tablet, Rfl: 0 .  LORazepam (ATIVAN) 2 MG tablet, , Disp: , Rfl:  .  pantoprazole (PROTONIX) 40 MG tablet, Take 40 mg by mouth., Disp: , Rfl:  .  prochlorperazine (COMPAZINE) 10 MG tablet, Take by mouth., Disp: , Rfl:  .  sulfamethoxazole-trimethoprim (BACTRIM DS,SEPTRA DS) 800-160 MG tablet, Take 1 tablet by mouth 2 (two) times daily. Start the day prior to foley removal appointment, Disp: 6 tablet, Rfl: 0 .  tadalafil (CIALIS) 5 MG tablet, Take 5 mg by mouth., Disp: , Rfl:  .  zolpidem (AMBIEN) 10 MG tablet, Take 10 mg by mouth., Disp: , Rfl: :  No Known Allergies:  Family History  Problem Relation Age of Onset  . Cancer Mother     kidney  . Cancer Father  colon  . Cancer Brother     brain  :  Social History   Social History  . Marital Status: Single    Spouse Name: N/A  . Number of Children: N/A  . Years of Education: N/A   Occupational History  . Not on file.   Social History Main Topics  . Smoking status: Never Smoker   . Smokeless tobacco: Never Used  . Alcohol Use: Yes     Comment: occasional  . Drug Use: No  . Sexual Activity: No   Other Topics  Concern  . Not on file   Social History Narrative  :  Pertinent items are noted in HPI.  Exam: There were no vitals taken for this visit. General appearance: alert and cooperative Head: Normocephalic, without obvious abnormality Throat: lips, mucosa, and tongue normal; teeth and gums normal Neck: no adenopathy Back: negative Resp: clear to auscultation bilaterally Chest wall: no tenderness Cardio: regular rate and rhythm, S1, S2 normal, no murmur, click, rub or gallop GI: soft, non-tender; bowel sounds normal; no masses,  no organomegaly Extremities: extremities normal, atraumatic, no cyanosis or edema Pulses: 2+ and symmetric Skin: Skin color, texture, turgor normal. No rashes or lesions Lymph nodes: Cervical, supraclavicular, and axillary nodes normal.    CBC    Component Value Date/Time   WBC 6.0 06/29/2015 1415   RBC 4.55 06/29/2015 1415   HGB 11.1* 07/02/2015 0430   HCT 32.8* 07/02/2015 0430   PLT 255 06/29/2015 1415   MCV 97.1 06/29/2015 1415   MCH 31.4 06/29/2015 1415   MCHC 32.4 06/29/2015 1415   RDW 13.1 06/29/2015 1415      Assessment and Plan:   75 year old gentleman with the following issues:  1. Prostate cancer diagnosed in January 2017. He has a Gleason score 4+5 = 9 and a PSA of 9.8. He is status post radical prostatectomy with the final pathology revealed T3a N1 disease. He has 2 out of 10 lymph nodes involved. He has positive margins and extraprostatic extension.  He is currently receiving adjuvant therapy with androgen deprivation as well as Taxotere chemotherapy as a part of a clinical trial. His imaging studies including CT scan and a bone scan on 09/23/2015 showed no evidence of metastatic disease. Her last PSA continues to be undetectable as of late.  The natural course of this disease was discussed and certainly he is receiving appropriate therapy. He will require adjuvant radiation therapy after he completes his fourth cycle of Taxotere which  is scheduled for mid August. Radiation therapy would be 4-6 weeks after the completion of Taxotere.  At this time, he is tolerating therapy without any complications and I'm happy to assist in his care in the future from a medical oncology standpoint.  2. Growth factor support: He is not receiving any growth factor support after Taxotere. His CBC postchemotherapy were reviewed and showed no evidence of neutropenia.  3. Androgen depravation: He is currently receiving Trelstar without any major complications. Long-term effects of these treatments were reviewed today and he is comfortable with the current plan.

## 2015-11-01 ENCOUNTER — Encounter: Payer: Self-pay | Admitting: General Practice

## 2015-11-01 NOTE — Progress Notes (Signed)
[facility] Psychosocial Distress Screening Spiritual Care  Met with Mr Oehlert in [facility] to introduce [facility] team/resources, reviewing distress screen per protocol.  The patient scored a 4 on the Psychosocial Distress Thermometer which indicates moderate distress. Also assessed for distress and other psychosocial needs.   ONCBCN DISTRESS SCREENING 11/01/2015  Screening Type Initial Screening  Distress experienced in past week (1-10) 4  Referral to support programs Yes  Other Spiritual Care, counseling intern   Mr Nix shared in detail about his tx and support at [facility], verbalizing support for local team as well.  Per pt, he has significant experience with cancer in his family, including his wife's decline and death many years ago; per pt, receiving this dx was not a surprise, and he has been researching support resources for years.  He also notes that he has Nature conservation officer background and worked for many years with the [employer] for [employer] (now works as an Electrical engineer); per pt, he regularly uses these skills for anxiety management, meaning-making, and other healthy coping mechanisms.  Honored his toolkit and also encouraged inviting in additional supporters so that he doesn't have to manage all of his support by himself.  Per pt, gratitude for availability of such personalized, targeted care is another great help in coping.  Follow up needed: No.  Pt aware of ongoing [facility] team/resource availability, but please also page if needs arise/circumstances change.  Thank you.  [place], [place], [facility] Pager [phone] Voicemail [phone]

## 2015-11-02 ENCOUNTER — Telehealth: Payer: Self-pay | Admitting: *Deleted

## 2015-11-02 ENCOUNTER — Encounter: Payer: Self-pay | Admitting: Radiation Oncology

## 2015-11-02 ENCOUNTER — Other Ambulatory Visit: Payer: Self-pay | Admitting: Radiation Oncology

## 2015-11-02 NOTE — Addendum Note (Signed)
Encounter addended by: Heywood Footman, RN on: 11/02/2015 10:33 AM<BR>     Documentation filed: Orders, Medications, Demographics Visit, Charges VN, Flowsheet VN

## 2015-11-02 NOTE — Telephone Encounter (Signed)
CALLED PATIENT TO INFORM OF [facility] APPT. WITH DR. MANNING ON 12/08/15- 3:30 PM FOR THE NURSE AND 4 PM WITH DR. MANNING, LVM FOR A RETURN CALL

## 2015-11-03 DIAGNOSIS — C61 Malignant neoplasm of prostate: Secondary | ICD-10-CM | POA: Diagnosis not present

## 2015-11-08 DIAGNOSIS — C61 Malignant neoplasm of prostate: Secondary | ICD-10-CM | POA: Diagnosis not present

## 2015-11-08 DIAGNOSIS — Z006 Encounter for examination for normal comparison and control in clinical research program: Secondary | ICD-10-CM | POA: Diagnosis not present

## 2015-11-09 ENCOUNTER — Ambulatory Visit: Payer: Medicare Other | Admitting: Oncology

## 2015-11-15 DIAGNOSIS — C61 Malignant neoplasm of prostate: Secondary | ICD-10-CM | POA: Diagnosis not present

## 2015-11-24 DIAGNOSIS — C61 Malignant neoplasm of prostate: Secondary | ICD-10-CM | POA: Diagnosis not present

## 2015-11-29 DIAGNOSIS — C61 Malignant neoplasm of prostate: Secondary | ICD-10-CM | POA: Diagnosis not present

## 2015-11-29 DIAGNOSIS — Z006 Encounter for examination for normal comparison and control in clinical research program: Secondary | ICD-10-CM | POA: Diagnosis not present

## 2015-12-01 ENCOUNTER — Other Ambulatory Visit: Payer: Self-pay | Admitting: Medical Oncology

## 2015-12-01 DIAGNOSIS — C61 Malignant neoplasm of prostate: Secondary | ICD-10-CM

## 2015-12-06 DIAGNOSIS — C61 Malignant neoplasm of prostate: Secondary | ICD-10-CM | POA: Diagnosis not present

## 2015-12-06 NOTE — Progress Notes (Signed)
GU Location of Tumor / Histology: Prostate Cancer  If Prostate Cancer, Gleason Score is (3 + 4=7) and PSA is (0.02)  Kirk Ruths presented with elevated PSA of 9.8 in December 2016.   Biopsies of prostate (if applicable) revealed:    Past/Anticipated interventions by urology, if any: 07/01/15: XI ROBOTIC ASSISTED LAPAROSCOPIC RADICAL PROSTATECTOMY LEVEL 2 LYMPHADENECTOMY  Past/Anticipated interventions by medical oncology, if any: Adjuvant clinical trial utilizing androgen deprivation Trelstar receives every 90 days due December 30, 2015 and  Completed 8-14-17Taxotere chemotherapy  Dr. Alen Blew   Weight changes, if any:  Wt Readings from Last 3 Encounters:  12/08/15 179 lb 14.4 oz (81.6 kg)  10/29/15 178 lb 3.2 oz (80.8 kg)  07/01/15 178 lb (80.7 kg)    Bowel/Bladder complaints, if any: None  Nausea/Vomiting, if any: Yes post chemotherapy since Friday, December 03, 2015 five days post chemotherapy  Pain issues, if any: None  SAFETY ISSUES:  Prior radiation? No  Pacemaker/ICD? No  Possible current pregnancy? No  Is the patient on methotrexate? No  Current Complaints / other details: I-PSS =4 BP 139/80 (BP Location: Right Arm, Patient Position: Sitting, Cuff Size: Normal)   Pulse 94   Temp 97.7 F (36.5 C) (Oral)   Resp 18   Ht 5\' 10"  (1.778 m)   Wt 179 lb 14.4 oz (81.6 kg)   SpO2 100%   BMI 25.81 kg/m

## 2015-12-08 ENCOUNTER — Encounter: Payer: Self-pay | Admitting: Medical Oncology

## 2015-12-08 ENCOUNTER — Ambulatory Visit
Admission: RE | Admit: 2015-12-08 | Discharge: 2015-12-08 | Disposition: A | Discharge status: Home / Self Care | Payer: Medicare Other | Source: Ambulatory Visit | Attending: Radiation Oncology | Admitting: Radiation Oncology

## 2015-12-08 ENCOUNTER — Encounter: Payer: Self-pay | Admitting: Radiation Oncology

## 2015-12-08 VITALS — BP 139/80 | HR 94 | Temp 97.7°F | Resp 18 | Ht 70.0 in | Wt 179.9 lb

## 2015-12-08 DIAGNOSIS — C61 Malignant neoplasm of prostate: Secondary | ICD-10-CM

## 2015-12-08 DIAGNOSIS — Z510 Encounter for antineoplastic radiation therapy: Secondary | ICD-10-CM | POA: Diagnosis not present

## 2015-12-08 NOTE — Progress Notes (Signed)
Oncology Nurse Navigator Documentation  Oncology Nurse Navigator Flowsheets 10/28/2015 10/29/2015 12/08/2015  Navigator Location - - -  Navigator Encounter Type Telephone Clinic/MDC Follow-up Appt  Telephone Outgoing Call;Appt Confirmation/Clarification - -  Abnormal Finding Date - - -  Surgery Date - - -  Patient Visit Type - Initial RadOnc;Follow-up  Treatment Phase - Pre-Tx/Tx Discussion Pre-Tx/Tx Discussion- Jared Jacobs here to discuss radiation as part of the clinical trial at [facility]. He finished his 4th cycle of Taxotere without any major side effects. He will have CT and bone scan 8/31, follower with CT sim 9/16.   Barriers/Navigation Needs - No barriers at this time Education  Education - - Occupational psychologist Treatment Options  Interventions - None required Education Method  Support Groups/Services - Friends and Family -  Acuity - - -  Acuity Level 1 - - -  Time Spent with Patient 15 > 120 -

## 2015-12-09 NOTE — Progress Notes (Signed)
Patient presents today to coordinate prostate radiotherapy.  He completed chemo on 8/14  Restaging scans on 8/31 then CT sim on 9/15 for radiotherapy  Plan 45 Gy in pelvis for 25 fractions, then boost fossa to 68.4 Gy

## 2015-12-15 ENCOUNTER — Other Ambulatory Visit: Payer: Self-pay | Admitting: *Deleted

## 2015-12-15 DIAGNOSIS — C61 Malignant neoplasm of prostate: Secondary | ICD-10-CM

## 2015-12-16 ENCOUNTER — Encounter (HOSPITAL_COMMUNITY): Payer: Medicare Other

## 2015-12-16 ENCOUNTER — Other Ambulatory Visit: Payer: Medicare Other

## 2015-12-16 ENCOUNTER — Ambulatory Visit (HOSPITAL_COMMUNITY)
Admission: RE | Admit: 2015-12-16 | Discharge: 2015-12-16 | Disposition: A | Discharge status: Home / Self Care | Payer: Medicare Other | Source: Ambulatory Visit | Attending: Radiation Oncology | Admitting: Radiation Oncology

## 2015-12-16 ENCOUNTER — Encounter (HOSPITAL_COMMUNITY)
Admission: RE | Admit: 2015-12-16 | Discharge: 2015-12-16 | Disposition: A | Discharge status: Home / Self Care | Payer: Medicare Other | Source: Ambulatory Visit | Attending: Radiation Oncology | Admitting: Radiation Oncology

## 2015-12-16 ENCOUNTER — Encounter (HOSPITAL_COMMUNITY): Payer: Self-pay

## 2015-12-16 DIAGNOSIS — I700 Atherosclerosis of aorta: Secondary | ICD-10-CM | POA: Insufficient documentation

## 2015-12-16 DIAGNOSIS — K5730 Diverticulosis of large intestine without perforation or abscess without bleeding: Secondary | ICD-10-CM | POA: Diagnosis not present

## 2015-12-16 DIAGNOSIS — Z9079 Acquired absence of other genital organ(s): Secondary | ICD-10-CM | POA: Diagnosis not present

## 2015-12-16 DIAGNOSIS — I2510 Atherosclerotic heart disease of native coronary artery without angina pectoris: Secondary | ICD-10-CM | POA: Insufficient documentation

## 2015-12-16 DIAGNOSIS — C61 Malignant neoplasm of prostate: Secondary | ICD-10-CM

## 2015-12-16 DIAGNOSIS — N200 Calculus of kidney: Secondary | ICD-10-CM | POA: Insufficient documentation

## 2015-12-16 LAB — COMPREHENSIVE METABOLIC PANEL
ALBUMIN: 3.2 g/dL — AB (ref 3.5–5.0)
ALK PHOS: 103 U/L (ref 40–150)
ALT: 20 U/L (ref 0–55)
AST: 18 U/L (ref 5–34)
Anion Gap: 9 mEq/L (ref 3–11)
BUN: 11.5 mg/dL (ref 7.0–26.0)
CO2: 22 meq/L (ref 22–29)
Calcium: 9.3 mg/dL (ref 8.4–10.4)
Chloride: 111 mEq/L — ABNORMAL HIGH (ref 98–109)
Creatinine: 1.2 mg/dL (ref 0.7–1.3)
EGFR: 62 mL/min/{1.73_m2} — AB (ref >90)
GLUCOSE: 104 mg/dL (ref 70–140)
POTASSIUM: 4.3 meq/L (ref 3.5–5.1)
SODIUM: 141 meq/L (ref 136–145)
Total Bilirubin: 0.4 mg/dL (ref 0.20–1.20)
Total Protein: 6.5 g/dL (ref 6.4–8.3)

## 2015-12-16 MED ORDER — TECHNETIUM TC 99M MEDRONATE IV KIT
20.4000 | PACK | Freq: Once | INTRAVENOUS | Status: AC | PRN
  Administered 2015-12-16: 20.4 via INTRAVENOUS

## 2015-12-16 MED ORDER — IOPAMIDOL (ISOVUE-300) INJECTION 61%
100.0000 mL | Freq: Once | INTRAVENOUS | Status: AC | PRN
Start: 2015-12-16 — End: 2015-12-16
  Administered 2015-12-16: 100 mL via INTRAVENOUS

## 2015-12-16 MED ORDER — IOPAMIDOL (ISOVUE-300) INJECTION 61%
30.0000 mL | Freq: Once | INTRAVENOUS | Status: AC | PRN
  Administered 2015-12-16: 30 mL via ORAL

## 2015-12-17 ENCOUNTER — Telehealth: Payer: Self-pay | Admitting: Radiation Oncology

## 2015-12-17 DIAGNOSIS — C61 Malignant neoplasm of prostate: Secondary | ICD-10-CM | POA: Diagnosis not present

## 2015-12-17 LAB — PSA

## 2015-12-17 NOTE — Progress Notes (Signed)
Please call patient with normal result.  Thanks. MM 

## 2015-12-17 NOTE — Telephone Encounter (Signed)
Patient returned this RN's call. Explained his recent CT scan proved to be normal. Patient verbalized understanding and expressed appreciation for the call.

## 2015-12-17 NOTE — Telephone Encounter (Signed)
Phoned patient's home and cell. No answer. Left message requesting return call. Calling patient to inform him of normal scan results per Dr. Tammi Klippel. Will continue to try and reach patient via phone.

## 2015-12-17 NOTE — Telephone Encounter (Signed)
-----   Message from Tyler Pita, MD sent at 12/17/2015 12:07 PM EDT ----- Please call patient with normal result.  Thanks. MM

## 2015-12-27 ENCOUNTER — Encounter: Payer: Self-pay | Admitting: Medical Oncology

## 2015-12-28 ENCOUNTER — Other Ambulatory Visit: Payer: Self-pay | Admitting: Oncology

## 2015-12-28 NOTE — Progress Notes (Signed)
I called Diane Eileen Stanford, NP [facility] research) to confirm when patient should start radiation. She states per Dr. [name] he can start radiation 6 weeks after his last chemotherapy which was August 14,2017. He will also need a 3 month injection of Lupron. This message was shared with Dr. Georgiana Shore and Dr. Alen Blew.

## 2015-12-29 DIAGNOSIS — Z800 Family history of malignant neoplasm of digestive organs: Secondary | ICD-10-CM | POA: Diagnosis not present

## 2015-12-29 DIAGNOSIS — C61 Malignant neoplasm of prostate: Secondary | ICD-10-CM | POA: Diagnosis not present

## 2015-12-29 DIAGNOSIS — K5730 Diverticulosis of large intestine without perforation or abscess without bleeding: Secondary | ICD-10-CM | POA: Diagnosis not present

## 2015-12-29 DIAGNOSIS — Z86010 Personal history of colonic polyps: Secondary | ICD-10-CM | POA: Diagnosis not present

## 2015-12-30 ENCOUNTER — Telehealth: Payer: Self-pay | Admitting: Medical Oncology

## 2015-12-30 NOTE — Telephone Encounter (Signed)
Spoke with Jared Jacobs to confirm appointments for  CT simulation and injection. Explained to him the registration process for each.

## 2015-12-31 ENCOUNTER — Ambulatory Visit (HOSPITAL_BASED_OUTPATIENT_CLINIC_OR_DEPARTMENT_OTHER): Payer: Medicare Other

## 2015-12-31 ENCOUNTER — Encounter: Payer: Self-pay | Admitting: Medical Oncology

## 2015-12-31 ENCOUNTER — Ambulatory Visit
Admission: RE | Admit: 2015-12-31 | Discharge: 2015-12-31 | Disposition: A | Discharge status: Home / Self Care | Payer: Medicare Other | Source: Ambulatory Visit | Attending: Radiation Oncology | Admitting: Radiation Oncology

## 2015-12-31 VITALS — BP 128/89 | HR 88 | Temp 98.3°F | Resp 16

## 2015-12-31 DIAGNOSIS — Z5111 Encounter for antineoplastic chemotherapy: Secondary | ICD-10-CM | POA: Diagnosis not present

## 2015-12-31 DIAGNOSIS — C61 Malignant neoplasm of prostate: Secondary | ICD-10-CM | POA: Diagnosis not present

## 2015-12-31 DIAGNOSIS — Z510 Encounter for antineoplastic radiation therapy: Secondary | ICD-10-CM | POA: Diagnosis not present

## 2015-12-31 MED ORDER — LEUPROLIDE ACETATE (4 MONTH) 30 MG IM KIT
22.5000 mg | PACK | Freq: Once | INTRAMUSCULAR | Status: AC
  Administered 2015-12-31: 22.5 mg via INTRAMUSCULAR
  Filled 2015-12-31: qty 30

## 2015-12-31 NOTE — Progress Notes (Signed)
  Radiation Oncology         [phone] ________________________________  Name: Jared Jacobs MRN: [id number]  Date: 12/31/2015  DOB: 04-May-1940  SIMULATION AND TREATMENT PLANNING NOTE    ICD-9-CM ICD-10-CM   1. Prostate cancer ([place]) 185 C61     DIAGNOSIS:  75 year-old gentleman with pT3 N1 M0 prostatic adenocarcinoma, Gleason Score 4+5, and pre-op PSA=9.81 and post-op PSA=0.02  NARRATIVE:  The patient was brought to the [place].  Identity was confirmed.  All relevant records and images related to the planned course of therapy were reviewed.  The patient freely provided informed written consent to proceed with treatment after reviewing the details related to the planned course of therapy. The consent form was witnessed and verified by the simulation staff.  Then, the patient was set-up in a stable reproducible supine position for radiation therapy.  A vacuum lock pillow device was custom fabricated to position his legs in a reproducible immobilized position.  Then, I performed a urethrogram under sterile conditions to identify the prostatic apex.  CT images were obtained.  Surface markings were placed.  The CT images were loaded into the planning software.  Then the prostate target and avoidance structures including the rectum, bladder, bowel and hips were contoured.  Treatment planning then occurred.  The radiation prescription was entered and confirmed.  A total of one complex treatment devices were fabricated. I have requested : Intensity Modulated Radiotherapy (IMRT) is medically necessary for this case for the following reason:  Rectal sparing.Marland Kitchen  PLAN:  The patient will receive 68.4 Gy in 38 fractions with 45 Gy to the pelvis and then boost  ________________________________  Sheral Apley. Tammi Klippel, M.D.  This document serves as a record of services personally performed by Tyler Pita, MD. It was created on his behalf by Darcus Austin, a trained medical scribe. The  creation of this record is based on the scribe's personal observations and the provider's statements to them. This document has been checked and approved by the attending provider.

## 2016-01-07 DIAGNOSIS — Z510 Encounter for antineoplastic radiation therapy: Secondary | ICD-10-CM | POA: Diagnosis not present

## 2016-01-07 DIAGNOSIS — C61 Malignant neoplasm of prostate: Secondary | ICD-10-CM | POA: Diagnosis not present

## 2016-01-11 ENCOUNTER — Ambulatory Visit: Payer: Medicare Other | Admitting: Radiation Oncology

## 2016-01-11 DIAGNOSIS — K5730 Diverticulosis of large intestine without perforation or abscess without bleeding: Secondary | ICD-10-CM | POA: Diagnosis not present

## 2016-01-11 DIAGNOSIS — Z86010 Personal history of colonic polyps: Secondary | ICD-10-CM | POA: Diagnosis not present

## 2016-01-12 ENCOUNTER — Encounter: Payer: Self-pay | Admitting: Medical Oncology

## 2016-01-12 ENCOUNTER — Ambulatory Visit: Payer: Medicare Other

## 2016-01-12 ENCOUNTER — Ambulatory Visit
Admission: RE | Admit: 2016-01-12 | Discharge: 2016-01-12 | Disposition: A | Discharge status: Home / Self Care | Payer: Medicare Other | Source: Ambulatory Visit | Attending: Radiation Oncology | Admitting: Radiation Oncology

## 2016-01-12 DIAGNOSIS — C61 Malignant neoplasm of prostate: Secondary | ICD-10-CM | POA: Diagnosis not present

## 2016-01-12 DIAGNOSIS — Z510 Encounter for antineoplastic radiation therapy: Secondary | ICD-10-CM | POA: Diagnosis not present

## 2016-01-13 ENCOUNTER — Ambulatory Visit
Admission: RE | Admit: 2016-01-13 | Discharge: 2016-01-13 | Disposition: A | Discharge status: Home / Self Care | Payer: Medicare Other | Source: Ambulatory Visit | Attending: Radiation Oncology | Admitting: Radiation Oncology

## 2016-01-13 ENCOUNTER — Inpatient Hospital Stay: Admission: RE | Admit: 2016-01-13 | Payer: Self-pay | Source: Ambulatory Visit | Admitting: Radiation Oncology

## 2016-01-13 DIAGNOSIS — Z510 Encounter for antineoplastic radiation therapy: Secondary | ICD-10-CM | POA: Diagnosis not present

## 2016-01-13 DIAGNOSIS — C61 Malignant neoplasm of prostate: Secondary | ICD-10-CM | POA: Diagnosis not present

## 2016-01-14 ENCOUNTER — Ambulatory Visit
Admission: RE | Admit: 2016-01-14 | Discharge: 2016-01-14 | Disposition: A | Discharge status: Home / Self Care | Payer: Medicare Other | Source: Ambulatory Visit | Attending: Radiation Oncology | Admitting: Radiation Oncology

## 2016-01-14 ENCOUNTER — Encounter: Payer: Self-pay | Admitting: Radiation Oncology

## 2016-01-14 VITALS — BP 150/103 | HR 97 | Resp 16 | Wt 186.3 lb

## 2016-01-14 DIAGNOSIS — Z510 Encounter for antineoplastic radiation therapy: Secondary | ICD-10-CM | POA: Diagnosis not present

## 2016-01-14 DIAGNOSIS — C61 Malignant neoplasm of prostate: Secondary | ICD-10-CM | POA: Diagnosis not present

## 2016-01-14 NOTE — Progress Notes (Signed)
  Radiation Oncology         [phone]   Name: Jared Jacobs MRN: [id number]   Date: 01/14/2016  DOB: October 19, 1940   Weekly Radiation Therapy Management    ICD-9-CM ICD-10-CM   1. Prostate cancer ([place]) 185 C61     Current Dose: 5.4 Gy  Planned Dose:  68.4 Gy  Narrative The patient presents for routine under treatment assessment.  Weight stable. BP high.  Denies pain, dysuria, hematuria, incontinence, leakage, diarrhea, or fatigue. Reports nocturia x 4 last night, his baseline is x1. The patient reports a medical history of interstitial cystitis. The patient has a few vacations scheduled in November.  Set-up films were reviewed. The chart was checked.  Physical Findings  weight is 186 lb 4.8 oz (84.5 kg). His blood pressure is 150/103 (abnormal) and his pulse is 97. His respiration is 16 and oxygen saturation is 100%. . Weight essentially stable.  No significant changes.  Impression The patient is tolerating radiation.  Plan Continue treatment as planned. I had to advise the patient that, unfortunately, he will have to cancel a few days of vacation in November to not miss too many radiation treatments. He will check his schedule and notify us of any changes.    Sheral Apley Tammi Klippel, M.D.  This document serves as a record of services personally performed by Tyler Pita, MD. It was created on his behalf by Darcus Austin, a trained medical scribe. The creation of this record is based on the scribe's personal observations and the provider's statements to them. This document has been checked and approved by the attending provider.

## 2016-01-14 NOTE — Progress Notes (Signed)
Weight stable. BP high.  Denies pain. Reports nocturia x 4. Denies dysuria or hematuria. Denies incontinence or leakage. Denies diarrhea. Denies fatigue.   BP (!) 150/103 (BP Location: Right Arm, Patient Position: Sitting, Cuff Size: Normal)   Pulse 97   Resp 16   Wt 186 lb 4.8 oz (84.5 kg)   SpO2 100%   BMI 26.73 kg/m  Wt Readings from Last 3 Encounters:  01/14/16 186 lb 4.8 oz (84.5 kg)  12/08/15 179 lb 14.4 oz (81.6 kg)  10/29/15 178 lb 3.2 oz (80.8 kg)

## 2016-01-15 NOTE — Addendum Note (Signed)
Encounter addended by: Heywood Footman, RN on: 01/15/2016 10:28 AM<BR>    Actions taken: Patient Education assessment filed

## 2016-01-17 ENCOUNTER — Ambulatory Visit: Payer: Medicare Other

## 2016-01-18 ENCOUNTER — Ambulatory Visit: Payer: Medicare Other

## 2016-01-19 ENCOUNTER — Ambulatory Visit: Payer: Medicare Other

## 2016-01-20 ENCOUNTER — Ambulatory Visit
Admission: RE | Admit: 2016-01-20 | Discharge: 2016-01-20 | Disposition: A | Discharge status: Home / Self Care | Payer: Medicare Other | Source: Ambulatory Visit | Attending: Radiation Oncology | Admitting: Radiation Oncology

## 2016-01-20 DIAGNOSIS — Z510 Encounter for antineoplastic radiation therapy: Secondary | ICD-10-CM | POA: Diagnosis not present

## 2016-01-20 DIAGNOSIS — C61 Malignant neoplasm of prostate: Secondary | ICD-10-CM | POA: Diagnosis not present

## 2016-01-21 ENCOUNTER — Ambulatory Visit: Admission: RE | Admit: 2016-01-21 | Payer: Medicare Other | Source: Ambulatory Visit | Admitting: Radiation Oncology

## 2016-01-21 ENCOUNTER — Ambulatory Visit
Admission: RE | Admit: 2016-01-21 | Discharge: 2016-01-21 | Disposition: A | Discharge status: Home / Self Care | Payer: Medicare Other | Source: Ambulatory Visit | Attending: Radiation Oncology | Admitting: Radiation Oncology

## 2016-01-21 DIAGNOSIS — C61 Malignant neoplasm of prostate: Secondary | ICD-10-CM | POA: Diagnosis not present

## 2016-01-21 DIAGNOSIS — Z510 Encounter for antineoplastic radiation therapy: Secondary | ICD-10-CM | POA: Diagnosis not present

## 2016-01-24 ENCOUNTER — Ambulatory Visit
Admission: RE | Admit: 2016-01-24 | Discharge: 2016-01-24 | Disposition: A | Discharge status: Home / Self Care | Payer: Medicare Other | Source: Ambulatory Visit | Attending: Radiation Oncology | Admitting: Radiation Oncology

## 2016-01-24 DIAGNOSIS — C61 Malignant neoplasm of prostate: Secondary | ICD-10-CM | POA: Diagnosis not present

## 2016-01-24 DIAGNOSIS — Z510 Encounter for antineoplastic radiation therapy: Secondary | ICD-10-CM | POA: Diagnosis not present

## 2016-01-25 ENCOUNTER — Ambulatory Visit
Admission: RE | Admit: 2016-01-25 | Discharge: 2016-01-25 | Disposition: A | Discharge status: Home / Self Care | Payer: Medicare Other | Source: Ambulatory Visit | Attending: Radiation Oncology | Admitting: Radiation Oncology

## 2016-01-25 DIAGNOSIS — Z510 Encounter for antineoplastic radiation therapy: Secondary | ICD-10-CM | POA: Diagnosis not present

## 2016-01-25 DIAGNOSIS — C61 Malignant neoplasm of prostate: Secondary | ICD-10-CM | POA: Diagnosis not present

## 2016-01-26 ENCOUNTER — Ambulatory Visit
Admission: RE | Admit: 2016-01-26 | Discharge: 2016-01-26 | Disposition: A | Discharge status: Home / Self Care | Payer: Medicare Other | Source: Ambulatory Visit | Attending: Radiation Oncology | Admitting: Radiation Oncology

## 2016-01-26 DIAGNOSIS — C61 Malignant neoplasm of prostate: Secondary | ICD-10-CM | POA: Diagnosis not present

## 2016-01-26 DIAGNOSIS — Z510 Encounter for antineoplastic radiation therapy: Secondary | ICD-10-CM | POA: Diagnosis not present

## 2016-01-27 ENCOUNTER — Ambulatory Visit
Admission: RE | Admit: 2016-01-27 | Discharge: 2016-01-27 | Disposition: A | Discharge status: Home / Self Care | Payer: Medicare Other | Source: Ambulatory Visit | Attending: Radiation Oncology | Admitting: Radiation Oncology

## 2016-01-27 VITALS — BP 145/96 | HR 84 | Resp 16 | Wt 182.2 lb

## 2016-01-27 DIAGNOSIS — C61 Malignant neoplasm of prostate: Secondary | ICD-10-CM

## 2016-01-27 DIAGNOSIS — Z510 Encounter for antineoplastic radiation therapy: Secondary | ICD-10-CM | POA: Diagnosis not present

## 2016-01-27 NOTE — Progress Notes (Signed)
  Radiation Oncology         [phone]   Name: Jared Jacobs MRN: [id number]   Date: 01/27/2016  DOB: 1941-04-06   Weekly Radiation Therapy Management    ICD-9-CM ICD-10-CM   1. Prostate cancer ([place]) 185 C61     Current Dose: 16.2 Gy  Planned Dose:  68.4 Gy  Narrative The patient presents for routine under treatment assessment.  Weight and vitals stable. Denies pain. Reports nocturia x 4. Denies dysuria or hematuria. Denies leakage or incontinence. Reports heavy physical activity causes SOB. Denies diarrhea. Reports nausea last afternoon. Questions if the nausea could be related to ADT received two weeks ago. Reports hot flashes continue.  Set-up films were reviewed. The chart was checked.  Physical Findings  weight is 182 lb 3.2 oz (82.6 kg). His blood pressure is 145/96 (abnormal) and his pulse is 84. His respiration is 16 and oxygen saturation is 100%. . Weight essentially stable.  No significant changes.  Impression The patient is tolerating radiation.  Plan Continue treatment as planned.    Sheral Apley Tammi Klippel, M.D.  This document serves as a record of services personally performed by Tyler Pita, MD. It was created on his behalf by Maryla Morrow, a trained medical scribe. The creation of this record is based on the scribe's personal observations and the provider's statements to them. This document has been checked and approved by the attending provider.

## 2016-01-27 NOTE — Progress Notes (Signed)
Weight and vitals stable. Denies pain. Reports nocturia x 4. Denies dysuria or hematuria. Denies leakage or incontinence. Reports heavy physical activity causes SOB. Denies diarrhea. Reports nausea last afternoon. Questions if the nausea could be related to ADT received two weeks ago. Reports hot flashes continue.   BP (!) 145/96 (BP Location: Left Arm, Patient Position: Sitting, Cuff Size: Normal)   Pulse 84   Resp 16   Wt 182 lb 3.2 oz (82.6 kg)   SpO2 100%   BMI 26.14 kg/m  Wt Readings from Last 3 Encounters:  01/27/16 182 lb 3.2 oz (82.6 kg)  01/14/16 186 lb 4.8 oz (84.5 kg)  12/08/15 179 lb 14.4 oz (81.6 kg)

## 2016-01-28 ENCOUNTER — Ambulatory Visit
Admission: RE | Admit: 2016-01-28 | Discharge: 2016-01-28 | Disposition: A | Discharge status: Home / Self Care | Payer: Medicare Other | Source: Ambulatory Visit | Attending: Radiation Oncology | Admitting: Radiation Oncology

## 2016-01-28 DIAGNOSIS — C61 Malignant neoplasm of prostate: Secondary | ICD-10-CM | POA: Diagnosis not present

## 2016-01-28 DIAGNOSIS — Z510 Encounter for antineoplastic radiation therapy: Secondary | ICD-10-CM | POA: Diagnosis not present

## 2016-01-31 ENCOUNTER — Ambulatory Visit
Admission: RE | Admit: 2016-01-31 | Discharge: 2016-01-31 | Disposition: A | Discharge status: Home / Self Care | Payer: Medicare Other | Source: Ambulatory Visit | Attending: Radiation Oncology | Admitting: Radiation Oncology

## 2016-01-31 ENCOUNTER — Encounter: Payer: Self-pay | Admitting: Medical Oncology

## 2016-01-31 DIAGNOSIS — Z510 Encounter for antineoplastic radiation therapy: Secondary | ICD-10-CM | POA: Diagnosis not present

## 2016-01-31 DIAGNOSIS — C61 Malignant neoplasm of prostate: Secondary | ICD-10-CM | POA: Diagnosis not present

## 2016-02-01 ENCOUNTER — Ambulatory Visit
Admission: RE | Admit: 2016-02-01 | Discharge: 2016-02-01 | Disposition: A | Discharge status: Home / Self Care | Payer: Medicare Other | Source: Ambulatory Visit | Attending: Radiation Oncology | Admitting: Radiation Oncology

## 2016-02-01 DIAGNOSIS — C61 Malignant neoplasm of prostate: Secondary | ICD-10-CM | POA: Diagnosis not present

## 2016-02-01 DIAGNOSIS — Z510 Encounter for antineoplastic radiation therapy: Secondary | ICD-10-CM | POA: Diagnosis not present

## 2016-02-02 ENCOUNTER — Ambulatory Visit
Admission: RE | Admit: 2016-02-02 | Discharge: 2016-02-02 | Disposition: A | Discharge status: Home / Self Care | Payer: Medicare Other | Source: Ambulatory Visit | Attending: Radiation Oncology | Admitting: Radiation Oncology

## 2016-02-02 DIAGNOSIS — C61 Malignant neoplasm of prostate: Secondary | ICD-10-CM | POA: Diagnosis not present

## 2016-02-02 DIAGNOSIS — Z510 Encounter for antineoplastic radiation therapy: Secondary | ICD-10-CM | POA: Diagnosis not present

## 2016-02-03 ENCOUNTER — Ambulatory Visit
Admission: RE | Admit: 2016-02-03 | Discharge: 2016-02-03 | Disposition: A | Discharge status: Home / Self Care | Payer: Medicare Other | Source: Ambulatory Visit | Attending: Radiation Oncology | Admitting: Radiation Oncology

## 2016-02-03 ENCOUNTER — Encounter: Payer: Self-pay | Admitting: Radiation Oncology

## 2016-02-03 VITALS — BP 138/86 | HR 96 | Temp 97.8°F | Resp 18 | Ht 70.0 in | Wt 182.6 lb

## 2016-02-03 DIAGNOSIS — C61 Malignant neoplasm of prostate: Secondary | ICD-10-CM | POA: Diagnosis not present

## 2016-02-03 DIAGNOSIS — Z510 Encounter for antineoplastic radiation therapy: Secondary | ICD-10-CM | POA: Diagnosis not present

## 2016-02-03 NOTE — Progress Notes (Signed)
Weight and vitals stable. Denies pain. Reports nocturia x 2-3. Denies dysuria or hematuria. Denies leakage or incontinence. Reports heavy physical activity causes SOB. Reports diarrhea Sunday night and Tuesday morning took Imodium stopped diarrhea.. Reports hot flashes continue.  Wt Readings from Last 3 Encounters:  02/03/16 182 lb 9.6 oz (82.8 kg)  01/27/16 182 lb 3.2 oz (82.6 kg)  01/14/16 186 lb 4.8 oz (84.5 kg)  BP 138/86 (BP Location: Right Arm, Patient Position: Sitting, Cuff Size: Normal)   Pulse 96   Temp 97.8 F (36.6 C) (Oral)   Resp 18   Ht 5\' 10"  (1.778 m)   Wt 182 lb 9.6 oz (82.8 kg)   BMI 26.20 kg/m

## 2016-02-03 NOTE — Progress Notes (Signed)
  Radiation Oncology         [phone]   Name: Jared Jacobs MRN: [id number]   Date: 02/03/2016  DOB: May 21, 1940   Weekly Radiation Therapy Management    ICD-9-CM ICD-10-CM   1. Prostate cancer ([place]) 185 C61     Current Dose: 25.2 Gy  Planned Dose:  68.4 Gy  Narrative The patient presents for routine under treatment assessment.  Weight and vitals stable. Denies pain. Reports nocturia x 2-3. Denies dysuria or hematuria. Denies leakage or incontinence. Reports heavy physical activity causes SOB. He has reduced his heavy physical activity, though he continues to work out twice a week with a Clinical research associate. He focuses on strength training. Reports diarrhea Sunday night and Tuesday morning took Imodium stopped diarrhea - admits the diarrhea may have been related to what he ate. Reports hot flashes continue. He typically goes to bed at 10 pm and stops drinking around 8:30 pm or 9 pm. He notes if he takes an Ambien he has less nocturia. He does not feel as though he is urinating more than usual. He still reports an awful taste in his mouth but attributes this to chemo. He is taking Vitamin A and Vitamin D supplements.   Set-up films were reviewed. The chart was checked.  Physical Findings  height is 5\' 10"  (1.778 m) and weight is 182 lb 9.6 oz (82.8 kg). His oral temperature is 97.8 F (36.6 C). His blood pressure is 138/86 and his pulse is 96. His respiration is 18. . Weight essentially stable.  No significant changes.  Impression The patient is tolerating radiation.  Plan Continue treatment as planned.    Sheral Apley Tammi Klippel, M.D.  This document serves as a record of services personally performed by Tyler Pita, MD. It was created on his behalf by Arlyce Harman, a trained medical scribe. The creation of this record is based on the scribe's personal observations and the provider's statements to them. This document has been checked and approved by the attending provider.

## 2016-02-04 ENCOUNTER — Ambulatory Visit
Admission: RE | Admit: 2016-02-04 | Discharge: 2016-02-04 | Disposition: A | Discharge status: Home / Self Care | Payer: Medicare Other | Source: Ambulatory Visit | Attending: Radiation Oncology | Admitting: Radiation Oncology

## 2016-02-04 DIAGNOSIS — Z510 Encounter for antineoplastic radiation therapy: Secondary | ICD-10-CM | POA: Diagnosis not present

## 2016-02-04 DIAGNOSIS — C61 Malignant neoplasm of prostate: Secondary | ICD-10-CM | POA: Diagnosis not present

## 2016-02-07 ENCOUNTER — Ambulatory Visit
Admission: RE | Admit: 2016-02-07 | Discharge: 2016-02-07 | Disposition: A | Discharge status: Home / Self Care | Payer: Medicare Other | Source: Ambulatory Visit | Attending: Radiation Oncology | Admitting: Radiation Oncology

## 2016-02-07 DIAGNOSIS — Z510 Encounter for antineoplastic radiation therapy: Secondary | ICD-10-CM | POA: Diagnosis not present

## 2016-02-07 DIAGNOSIS — C61 Malignant neoplasm of prostate: Secondary | ICD-10-CM | POA: Diagnosis not present

## 2016-02-08 ENCOUNTER — Ambulatory Visit
Admission: RE | Admit: 2016-02-08 | Discharge: 2016-02-08 | Disposition: A | Discharge status: Home / Self Care | Payer: Medicare Other | Source: Ambulatory Visit | Attending: Radiation Oncology | Admitting: Radiation Oncology

## 2016-02-08 DIAGNOSIS — C61 Malignant neoplasm of prostate: Secondary | ICD-10-CM | POA: Diagnosis not present

## 2016-02-08 DIAGNOSIS — Z510 Encounter for antineoplastic radiation therapy: Secondary | ICD-10-CM | POA: Diagnosis not present

## 2016-02-09 ENCOUNTER — Ambulatory Visit
Admission: RE | Admit: 2016-02-09 | Discharge: 2016-02-09 | Disposition: A | Discharge status: Home / Self Care | Payer: Medicare Other | Source: Ambulatory Visit | Attending: Radiation Oncology | Admitting: Radiation Oncology

## 2016-02-09 DIAGNOSIS — Z510 Encounter for antineoplastic radiation therapy: Secondary | ICD-10-CM | POA: Diagnosis not present

## 2016-02-09 DIAGNOSIS — C61 Malignant neoplasm of prostate: Secondary | ICD-10-CM | POA: Diagnosis not present

## 2016-02-10 ENCOUNTER — Ambulatory Visit
Admission: RE | Admit: 2016-02-10 | Discharge: 2016-02-10 | Disposition: A | Discharge status: Home / Self Care | Payer: Medicare Other | Source: Ambulatory Visit | Attending: Radiation Oncology | Admitting: Radiation Oncology

## 2016-02-10 DIAGNOSIS — Z510 Encounter for antineoplastic radiation therapy: Secondary | ICD-10-CM | POA: Diagnosis not present

## 2016-02-10 DIAGNOSIS — C61 Malignant neoplasm of prostate: Secondary | ICD-10-CM | POA: Diagnosis not present

## 2016-02-11 ENCOUNTER — Encounter: Payer: Self-pay | Admitting: Radiation Oncology

## 2016-02-11 ENCOUNTER — Ambulatory Visit
Admission: RE | Admit: 2016-02-11 | Discharge: 2016-02-11 | Disposition: A | Discharge status: Home / Self Care | Payer: Medicare Other | Source: Ambulatory Visit | Attending: Radiation Oncology | Admitting: Radiation Oncology

## 2016-02-11 ENCOUNTER — Encounter: Payer: Self-pay | Admitting: Medical Oncology

## 2016-02-11 VITALS — BP 119/93 | HR 100 | Temp 97.8°F | Resp 18 | Ht 70.0 in | Wt 183.2 lb

## 2016-02-11 DIAGNOSIS — C61 Malignant neoplasm of prostate: Secondary | ICD-10-CM | POA: Diagnosis not present

## 2016-02-11 DIAGNOSIS — Z510 Encounter for antineoplastic radiation therapy: Secondary | ICD-10-CM | POA: Diagnosis not present

## 2016-02-11 MED ORDER — PROCHLORPERAZINE MALEATE 10 MG PO TABS: 10.0000 mg | ORAL_TABLET | Freq: Four times a day (QID) | ORAL | 5 refills | Status: DC | PRN

## 2016-02-11 NOTE — Progress Notes (Signed)
[facility] is tolerating radiation well but he states that he is fatigued. He also states he feels nauseated every evening around dinner time. He is not sure if this is from the radiation or from just having completed chemotherapy before the radiation. I asked him to discuss with Dr. Tammi Klippel. He has a follow up this morning after treatment. He voiced understanding. I asked him to call me with any questions or concerns.

## 2016-02-11 NOTE — Progress Notes (Signed)
Weight and vitals stable. Denies pain. Reports nocturia x 2-3. Denies dysuria or hematuria. Denies leakage or incontinence. Reports heavy physical activity causes SOB. Reports nausea around  1700 everyday.   Reports hot flashes continue.  Wt Readings from Last 3 Encounters:  02/11/16 183 lb 3.2 oz (83.1 kg)  02/03/16 182 lb 9.6 oz (82.8 kg)  01/27/16 182 lb 3.2 oz (82.6 kg)  BP (!) 119/93   Pulse 100   Temp 97.8 F (36.6 C) (Oral)   Resp 18   Ht 5\' 10"  (1.778 m)   Wt 183 lb 3.2 oz (83.1 kg)   SpO2 97%   BMI 26.29 kg/m

## 2016-02-11 NOTE — Progress Notes (Signed)
  Radiation Oncology         [phone]   Name: [facility] MRN: [id number]   Date: 02/11/2016  DOB: 03/14/1941   Weekly Radiation Therapy Management    ICD-9-CM ICD-10-CM   1. Prostate cancer ([facility]) 185 C61 prochlorperazine (COMPAZINE) 10 MG tablet    Current Dose: 36 Gy  Planned Dose:  68.4 Gy  Narrative The patient presents for routine under treatment assessment.  Weight and vitals stable. Patient denies pain. He reports nocturia x 2-3. He denies dysuria or hematuria, leakage or incontinence. The patient reports heavy physical activity causes SOB. He reports that he experiences nausea around 1700 every day; "I just feel yucky." He reports hot flashes continue.  Set-up films were reviewed. The chart was checked.  Physical Findings  height is 5\' 10"  (1.778 m) and weight is 183 lb 3.2 oz (83.1 kg). His oral temperature is 97.8 F (36.6 C). His blood pressure is 119/93 (abnormal) and his pulse is 100. His respiration is 18 and oxygen saturation is 97%. . Weight essentially stable.  No significant changes.  Impression The patient is tolerating radiation.  Plan Continue treatment as planned. We will prescribe Compazine for nausea. Patient confirms that he still has his prescription for Ativan, so I suggest he may take that approximately an hour before nausea typically occurs to help alleviate the symptom.    Sheral Apley Tammi Klippel, M.D.  This document serves as a record of services personally performed by Tyler Pita, MD and Shona Simpson, PA. It was created on his behalf by Maryla Morrow, a trained medical scribe. The creation of this record is based on the scribe's personal observations and the provider's statements to them. This document has been checked and approved by the attending provider.

## 2016-02-14 ENCOUNTER — Ambulatory Visit: Payer: Medicare Other

## 2016-02-15 ENCOUNTER — Ambulatory Visit: Payer: Medicare Other

## 2016-02-16 ENCOUNTER — Ambulatory Visit: Payer: Medicare Other

## 2016-02-17 ENCOUNTER — Ambulatory Visit: Payer: Medicare Other

## 2016-02-17 ENCOUNTER — Encounter: Payer: Self-pay | Admitting: Radiation Oncology

## 2016-02-17 ENCOUNTER — Ambulatory Visit
Admission: RE | Admit: 2016-02-17 | Discharge: 2016-02-17 | Disposition: A | Discharge status: Home / Self Care | Payer: Medicare Other | Source: Ambulatory Visit | Attending: Radiation Oncology | Admitting: Radiation Oncology

## 2016-02-17 VITALS — BP 116/87 | HR 98 | Temp 97.7°F | Resp 18 | Ht 70.0 in | Wt 180.0 lb

## 2016-02-17 DIAGNOSIS — C61 Malignant neoplasm of prostate: Secondary | ICD-10-CM

## 2016-02-17 DIAGNOSIS — Z510 Encounter for antineoplastic radiation therapy: Secondary | ICD-10-CM | POA: Diagnosis not present

## 2016-02-17 NOTE — Progress Notes (Signed)
  Radiation Oncology         [phone]   Name: [facility] MRN: [id number]   Date: 02/17/2016  DOB: December 23, 1940   Weekly Radiation Therapy Management    ICD-9-CM ICD-10-CM   1. Prostate cancer ([place]) 185 C61     Current Dose: 37.8 Gy  Planned Dose:  68.4 Gy  Narrative The patient presents for routine under treatment assessment.  Weight and vitals stable. The patient denies pain. He reports nocturia x 2. He denies dysuria or hematuria. Denies leakage or incontinence. The patient reports heavy physical activity causes SOB. He reports nausea has improved, but it remains unpredictable. Patient also reports hot flashes continue.  Set-up films were reviewed. The chart was checked.  Physical Findings  height is 5\' 10"  (1.778 m) and weight is 180 lb (81.6 kg). His oral temperature is 97.7 F (36.5 C). His blood pressure is 116/87 and his pulse is 98. His respiration is 18 and oxygen saturation is 98%.  Weight essentially stable.  No significant changes.  Impression The patient is tolerating radiation.  Plan Continue treatment as planned. I recommend following up with Dr. [name] regarding hot flashes.    Sheral Apley Tammi Klippel, M.D.  This document serves as a record of services personally performed by Tyler Pita, MD. It was created on his behalf by Maryla Morrow, a trained medical scribe. The creation of this record is based on the scribe's personal observations and the provider's statements to them. This document has been checked and approved by the attending provider.

## 2016-02-17 NOTE — Progress Notes (Signed)
Weight and vitals stable. Denies pain. Reports nocturia x 2. Denies dysuria or hematuria. Denies leakage or incontinence. Reports heavy physical activity causes SOB. Reports nausea has improved.   Reports hot flashes continue. Wt Readings from Last 3 Encounters:  02/17/16 180 lb (81.6 kg)  02/11/16 183 lb 3.2 oz (83.1 kg)  02/03/16 182 lb 9.6 oz (82.8 kg)  BP 116/87   Pulse 98   Temp 97.7 F (36.5 C) (Oral)   Resp 18   Ht 5\' 10"  (1.778 m)   Wt 180 lb (81.6 kg)   SpO2 98%   BMI 25.83 kg/m

## 2016-02-18 ENCOUNTER — Ambulatory Visit
Admission: RE | Admit: 2016-02-18 | Discharge: 2016-02-18 | Disposition: A | Discharge status: Home / Self Care | Payer: Medicare Other | Source: Ambulatory Visit | Attending: Radiation Oncology | Admitting: Radiation Oncology

## 2016-02-18 ENCOUNTER — Ambulatory Visit: Payer: Medicare Other

## 2016-02-18 DIAGNOSIS — Z510 Encounter for antineoplastic radiation therapy: Secondary | ICD-10-CM | POA: Diagnosis not present

## 2016-02-18 DIAGNOSIS — C61 Malignant neoplasm of prostate: Secondary | ICD-10-CM | POA: Diagnosis not present

## 2016-02-21 ENCOUNTER — Ambulatory Visit
Admission: RE | Admit: 2016-02-21 | Discharge: 2016-02-21 | Disposition: A | Discharge status: Home / Self Care | Payer: Medicare Other | Source: Ambulatory Visit | Attending: Radiation Oncology | Admitting: Radiation Oncology

## 2016-02-21 DIAGNOSIS — Z510 Encounter for antineoplastic radiation therapy: Secondary | ICD-10-CM | POA: Diagnosis not present

## 2016-02-21 DIAGNOSIS — C61 Malignant neoplasm of prostate: Secondary | ICD-10-CM | POA: Diagnosis not present

## 2016-02-22 ENCOUNTER — Ambulatory Visit
Admission: RE | Admit: 2016-02-22 | Discharge: 2016-02-22 | Disposition: A | Discharge status: Home / Self Care | Payer: Medicare Other | Source: Ambulatory Visit | Attending: Radiation Oncology | Admitting: Radiation Oncology

## 2016-02-22 ENCOUNTER — Encounter: Payer: Self-pay | Admitting: Medical Oncology

## 2016-02-22 ENCOUNTER — Ambulatory Visit: Payer: Medicare Other

## 2016-02-22 DIAGNOSIS — Z510 Encounter for antineoplastic radiation therapy: Secondary | ICD-10-CM | POA: Diagnosis not present

## 2016-02-22 DIAGNOSIS — C61 Malignant neoplasm of prostate: Secondary | ICD-10-CM | POA: Diagnosis not present

## 2016-02-23 ENCOUNTER — Ambulatory Visit: Payer: Medicare Other

## 2016-02-23 ENCOUNTER — Ambulatory Visit
Admission: RE | Admit: 2016-02-23 | Discharge: 2016-02-23 | Disposition: A | Discharge status: Home / Self Care | Payer: Medicare Other | Source: Ambulatory Visit | Attending: Radiation Oncology | Admitting: Radiation Oncology

## 2016-02-23 DIAGNOSIS — Z510 Encounter for antineoplastic radiation therapy: Secondary | ICD-10-CM | POA: Diagnosis not present

## 2016-02-23 DIAGNOSIS — C61 Malignant neoplasm of prostate: Secondary | ICD-10-CM | POA: Diagnosis not present

## 2016-02-24 ENCOUNTER — Encounter: Payer: Self-pay | Admitting: Radiation Oncology

## 2016-02-24 ENCOUNTER — Ambulatory Visit: Payer: Medicare Other

## 2016-02-24 ENCOUNTER — Ambulatory Visit
Admission: RE | Admit: 2016-02-24 | Discharge: 2016-02-24 | Disposition: A | Discharge status: Home / Self Care | Payer: Medicare Other | Source: Ambulatory Visit | Attending: Radiation Oncology | Admitting: Radiation Oncology

## 2016-02-24 VITALS — BP 108/68 | HR 107 | Temp 97.8°F | Resp 18 | Ht 70.0 in | Wt 184.2 lb

## 2016-02-24 DIAGNOSIS — Z510 Encounter for antineoplastic radiation therapy: Secondary | ICD-10-CM | POA: Diagnosis not present

## 2016-02-24 DIAGNOSIS — C61 Malignant neoplasm of prostate: Secondary | ICD-10-CM

## 2016-02-24 NOTE — Progress Notes (Signed)
Weight and vitals stable. Denies pain. Reports nocturia x 2. Denies dysuria or hematuria. Denies leakage or incontinence. Describes a strong steady urine stream without difficulty emptying his bladder. Diarrhea, loose stools about every two to three days not really diarrhea, will take 2 Imodium and it stops the loose stools.  Reports heavy physical activity causes SOB. Reports nausea has improved. Reports hot flashes continue.   Wt Readings from Last 3 Encounters:  02/24/16 184 lb 3.2 oz (83.6 kg)  02/17/16 180 lb (81.6 kg)  02/11/16 183 lb 3.2 oz (83.1 kg)  BP 108/68   Pulse (!) 107   Temp 97.8 F (36.6 C) (Oral)   Resp 18   Ht 5\' 10"  (1.778 m)   Wt 184 lb 3.2 oz (83.6 kg)   SpO2 99%   BMI 26.43 kg/m

## 2016-02-24 NOTE — Progress Notes (Signed)
  Radiation Oncology         [phone]   Name: [facility] MRN: [id number]   Date: 02/24/2016  DOB: 15-Aug-1940   Weekly Radiation Therapy Management    ICD-9-CM ICD-10-CM   1. Prostate cancer ([place]) 185 C61     Current Dose: 46.8 Gy  Planned Dose:  68.4 Gy  Narrative The patient presents for routine under treatment assessment.  Weight and vitals stable. He is feeling well overall. Denies pain. Reports nocturia x2. Denies dysuria or hematuria. Denies leakage or incontinence. Describes a strong steady urine stream without difficulty emptying his bladder. Reports loose stools about every two to three days though, not really diarrhea. Will take 2 imodium tablets and it stops the loose stools. Reports heavy physical activity causes SOB. Reports nausea has improved. Reports hot flashes continues. He would like to finish treatment before November 24th to allow for a vacation. He is okay with having a couple twice a day treatments to make this work.   Set-up films were reviewed. The chart was checked.  Physical Findings  height is 5\' 10"  (1.778 m) and weight is 184 lb 3.2 oz (83.6 kg). His oral temperature is 97.8 F (36.6 C). His blood pressure is 108/68 and his pulse is 107 (abnormal). His respiration is 18 and oxygen saturation is 99%.  Weight essentially stable.  No significant changes.  Impression The patient is tolerating radiation.  Plan Continue treatment as planned. Shona Simpson PA will look into scheduling the patient for twice a day treatments to allow him to finish before the Thanksgiving holiday.    [facility], M.D.  This document serves as a record of services personally performed by Jared Pita, MD and Shona Simpson, PA-C. It was created on his behalf by Arlyce Harman, a trained medical scribe. The creation of this record is based on the scribe's personal observations and the provider's statements to them. This document has been checked and approved by the  attending provider.

## 2016-02-25 ENCOUNTER — Ambulatory Visit
Admission: RE | Admit: 2016-02-25 | Discharge: 2016-02-25 | Disposition: A | Discharge status: Home / Self Care | Payer: Medicare Other | Source: Ambulatory Visit | Attending: Radiation Oncology | Admitting: Radiation Oncology

## 2016-02-25 DIAGNOSIS — Z510 Encounter for antineoplastic radiation therapy: Secondary | ICD-10-CM | POA: Diagnosis not present

## 2016-02-25 DIAGNOSIS — C61 Malignant neoplasm of prostate: Secondary | ICD-10-CM | POA: Diagnosis not present

## 2016-02-28 ENCOUNTER — Ambulatory Visit
Admission: RE | Admit: 2016-02-28 | Discharge: 2016-02-28 | Disposition: A | Discharge status: Home / Self Care | Payer: Medicare Other | Source: Ambulatory Visit | Attending: Radiation Oncology | Admitting: Radiation Oncology

## 2016-02-28 DIAGNOSIS — Z510 Encounter for antineoplastic radiation therapy: Secondary | ICD-10-CM | POA: Diagnosis not present

## 2016-02-28 DIAGNOSIS — C61 Malignant neoplasm of prostate: Secondary | ICD-10-CM | POA: Diagnosis not present

## 2016-02-29 ENCOUNTER — Ambulatory Visit
Admission: RE | Admit: 2016-02-29 | Discharge: 2016-02-29 | Disposition: A | Discharge status: Home / Self Care | Payer: Medicare Other | Source: Ambulatory Visit | Attending: Radiation Oncology | Admitting: Radiation Oncology

## 2016-02-29 DIAGNOSIS — C61 Malignant neoplasm of prostate: Secondary | ICD-10-CM | POA: Diagnosis not present

## 2016-02-29 DIAGNOSIS — Z510 Encounter for antineoplastic radiation therapy: Secondary | ICD-10-CM | POA: Diagnosis not present

## 2016-03-01 ENCOUNTER — Ambulatory Visit
Admission: RE | Admit: 2016-03-01 | Discharge: 2016-03-01 | Disposition: A | Discharge status: Home / Self Care | Payer: Medicare Other | Source: Ambulatory Visit | Attending: Radiation Oncology | Admitting: Radiation Oncology

## 2016-03-01 DIAGNOSIS — Z510 Encounter for antineoplastic radiation therapy: Secondary | ICD-10-CM | POA: Diagnosis not present

## 2016-03-01 DIAGNOSIS — C61 Malignant neoplasm of prostate: Secondary | ICD-10-CM | POA: Diagnosis not present

## 2016-03-02 ENCOUNTER — Ambulatory Visit
Admission: RE | Admit: 2016-03-02 | Discharge: 2016-03-02 | Disposition: A | Discharge status: Home / Self Care | Payer: Medicare Other | Source: Ambulatory Visit | Attending: Radiation Oncology | Admitting: Radiation Oncology

## 2016-03-02 VITALS — BP 136/99 | HR 100 | Resp 16 | Wt 182.6 lb

## 2016-03-02 DIAGNOSIS — C61 Malignant neoplasm of prostate: Secondary | ICD-10-CM | POA: Diagnosis not present

## 2016-03-02 DIAGNOSIS — Z510 Encounter for antineoplastic radiation therapy: Secondary | ICD-10-CM | POA: Diagnosis not present

## 2016-03-02 NOTE — Progress Notes (Signed)
Weight and vitals stable. Denies pain. Reports nocturia x 2. Denies dysuria or hematuria. Denies leakage or incontinence. Describes a strong steady urine stream without difficulty emptying his bladder. Describes soft bowel movements with cramping that resolve with one Imodium tablet. Denies diarrhea.  Reports heavy physical activity causes SOB. Reports nausea has greatly improved. Reports hot flashes continue.    BP (!) 136/99 (BP Location: Left Arm, Patient Position: Sitting, Cuff Size: Normal)   Pulse 100   Resp 16   Wt 182 lb 9.6 oz (82.8 kg)   SpO2 100%   BMI 26.20 kg/m  Wt Readings from Last 3 Encounters:  03/02/16 182 lb 9.6 oz (82.8 kg)  02/24/16 184 lb 3.2 oz (83.6 kg)  02/17/16 180 lb (81.6 kg)

## 2016-03-02 NOTE — Progress Notes (Signed)
  Radiation Oncology         [phone]   Name: [facility] MRN: [id number]   Date: 03/02/2016  DOB: [birth year]   Weekly Radiation Therapy Management    ICD-9-CM ICD-10-CM   1. Prostate cancer ([place]) 185 C61     Current Dose: 57.6 Gy  Planned Dose:  68.4 Gy  Narrative The patient presents for routine under treatment assessment.  Denies pain, dysuria, hematuria, leakage, incontinence, or diarrhea. Reports nocturia x 2. Describes a strong steady urine stream without difficulty emptying his bladder. Describes soft bowel movements with cramping that resolve with one Imodium tablet. Reports SOB with heavy physical activity. Reports nausea has greatly improved and hot flashes continue.  Set-up films were reviewed. The chart was checked.  Physical Findings  weight is 182 lb 9.6 oz (82.8 kg). His blood pressure is 136/99 (abnormal) and his pulse is 100. His respiration is 16 and oxygen saturation is 100%.  Weight essentially stable.  No significant changes.  Impression The patient is tolerating radiation.  Plan Continue treatment as planned. The patient is now scheduled for twice a day treatments to allow him to finish before the Thanksgiving holiday.    Sheral Apley Tammi Klippel, M.D.  This document serves as a record of services personally performed by Tyler Pita, MD. It was created on his behalf by Darcus Austin, a trained medical scribe. The creation of this record is based on the scribe's personal observations and the provider's statements to them. This document has been checked and approved by the attending provider.

## 2016-03-03 ENCOUNTER — Ambulatory Visit
Admission: RE | Admit: 2016-03-03 | Discharge: 2016-03-03 | Disposition: A | Discharge status: Home / Self Care | Payer: Medicare Other | Source: Ambulatory Visit | Attending: Radiation Oncology | Admitting: Radiation Oncology

## 2016-03-03 ENCOUNTER — Ambulatory Visit: Admission: RE | Admit: 2016-03-03 | Payer: Medicare Other | Source: Ambulatory Visit | Admitting: Radiation Oncology

## 2016-03-03 DIAGNOSIS — C61 Malignant neoplasm of prostate: Secondary | ICD-10-CM | POA: Diagnosis not present

## 2016-03-03 DIAGNOSIS — Z510 Encounter for antineoplastic radiation therapy: Secondary | ICD-10-CM | POA: Diagnosis not present

## 2016-03-05 ENCOUNTER — Ambulatory Visit
Admission: RE | Admit: 2016-03-05 | Discharge: 2016-03-05 | Disposition: A | Discharge status: Home / Self Care | Payer: Medicare Other | Source: Ambulatory Visit | Attending: Radiation Oncology | Admitting: Radiation Oncology

## 2016-03-05 DIAGNOSIS — C61 Malignant neoplasm of prostate: Secondary | ICD-10-CM | POA: Diagnosis not present

## 2016-03-05 DIAGNOSIS — Z510 Encounter for antineoplastic radiation therapy: Secondary | ICD-10-CM | POA: Diagnosis not present

## 2016-03-06 ENCOUNTER — Ambulatory Visit: Payer: Medicare Other

## 2016-03-06 ENCOUNTER — Ambulatory Visit
Admission: RE | Admit: 2016-03-06 | Discharge: 2016-03-06 | Disposition: A | Discharge status: Home / Self Care | Payer: Medicare Other | Source: Ambulatory Visit | Attending: Radiation Oncology | Admitting: Radiation Oncology

## 2016-03-06 DIAGNOSIS — C61 Malignant neoplasm of prostate: Secondary | ICD-10-CM | POA: Diagnosis not present

## 2016-03-06 DIAGNOSIS — Z510 Encounter for antineoplastic radiation therapy: Secondary | ICD-10-CM | POA: Diagnosis not present

## 2016-03-07 ENCOUNTER — Ambulatory Visit
Admission: RE | Admit: 2016-03-07 | Discharge: 2016-03-07 | Disposition: A | Discharge status: Home / Self Care | Payer: Medicare Other | Source: Ambulatory Visit | Attending: Radiation Oncology | Admitting: Radiation Oncology

## 2016-03-07 ENCOUNTER — Ambulatory Visit: Payer: Medicare Other

## 2016-03-07 DIAGNOSIS — Z510 Encounter for antineoplastic radiation therapy: Secondary | ICD-10-CM | POA: Diagnosis not present

## 2016-03-07 DIAGNOSIS — C61 Malignant neoplasm of prostate: Secondary | ICD-10-CM | POA: Diagnosis not present

## 2016-03-08 ENCOUNTER — Ambulatory Visit
Admission: RE | Admit: 2016-03-08 | Discharge: 2016-03-08 | Disposition: A | Discharge status: Home / Self Care | Payer: Medicare Other | Source: Ambulatory Visit | Attending: Radiation Oncology | Admitting: Radiation Oncology

## 2016-03-08 ENCOUNTER — Ambulatory Visit: Payer: Medicare Other

## 2016-03-08 ENCOUNTER — Encounter: Payer: Self-pay | Admitting: Radiation Oncology

## 2016-03-08 ENCOUNTER — Encounter: Payer: Self-pay | Admitting: Medical Oncology

## 2016-03-08 VITALS — BP 142/98 | HR 98 | Resp 16 | Wt 181.8 lb

## 2016-03-08 DIAGNOSIS — C61 Malignant neoplasm of prostate: Secondary | ICD-10-CM | POA: Diagnosis not present

## 2016-03-08 DIAGNOSIS — Z510 Encounter for antineoplastic radiation therapy: Secondary | ICD-10-CM | POA: Diagnosis not present

## 2016-03-08 NOTE — Progress Notes (Signed)
Weight and vitals stable. Denies pain. Reports nocturia x 2. Denies dysuria or hematuria. Denies leakage or incontinence. Describes a strong steady urine stream without difficulty emptying his bladder. Describes soft bowel movements with cramping that resolve with one Imodium tablet. Denies diarrhea. Reports heavy physical activity causes SOB. Reports nausea has greatly improved. Reports hot flashes continue. Reports new onset of chest congestion x 5 day. Completes treatment today thus one month follow up appointment card given. Scheduled to follow up at [facility] on 12/18 with labs. Scheduled for body and bone scan in February.  BP (!) 142/98 (BP Location: Left Arm, Patient Position: Sitting, Cuff Size: Normal)   Pulse 98   Resp 16   Wt 181 lb 12.8 oz (82.5 kg)   SpO2 100%   BMI 26.09 kg/m  Wt Readings from Last 3 Encounters:  03/08/16 181 lb 12.8 oz (82.5 kg)  03/02/16 182 lb 9.6 oz (82.8 kg)  02/24/16 184 lb 3.2 oz (83.6 kg)

## 2016-03-08 NOTE — Progress Notes (Signed)
  Radiation Oncology         [phone]   Name: Jared Jacobs MRN: [id number]   Date: 03/08/2016  DOB: 1940/10/11   Weekly Radiation Therapy Management    ICD-9-CM ICD-10-CM   1. Prostate cancer ([place]) 185 C61     Current Dose: 68.4 Gy  Planned Dose:  68.4 Gy  Narrative The patient presents for routine under treatment assessment.  Weight and vitals stable. Denies pain. Reports nocturia x 2. Denies dysuria or hematuria. Denies leakage or incontinence. Describes a strong steady urine stream without difficulty emptying his bladder. Describes soft bowel movements with cramping that resolve with one Imodium tablet. Denies diarrhea.  Reports heavy physical activity causes SOB. Reports nausea has greatly improved.  Reports hot flashes continue. Reports new onset of chest congestion x 5 day. Completes treatment today thus one month follow up appointment card given. Scheduled to follow up at [facility] on 12/18 with labs. Scheduled for body and bone scan in February.  Set-up films were reviewed. The chart was checked.  Physical Findings  weight is 181 lb 12.8 oz (82.5 kg). His blood pressure is 142/98 (abnormal) and his pulse is 98. His respiration is 16 and oxygen saturation is 100%.  Weight essentially stable.  No significant changes.  Impression The patient is tolerating radiation.  Plan He completed radiation treatment today. He will return for follow up in 1 month.     Sheral Apley Tammi Klippel, M.D.  This document serves as a record of services personally performed by Tyler Pita, MD. It was created on his behalf by Arlyce Harman, a trained medical scribe. The creation of this record is based on the scribe's personal observations and the provider's statements to them. This document has been checked and approved by the attending provider.

## 2016-03-09 ENCOUNTER — Ambulatory Visit: Payer: Medicare Other

## 2016-03-10 ENCOUNTER — Ambulatory Visit: Payer: Medicare Other

## 2016-03-11 NOTE — Progress Notes (Signed)
  Radiation Oncology         [phone] ________________________________  Name: Jared Jacobs MRN: [id number]  Date: 03/08/2016  DOB: 07/22/1940  End of Treatment Note  Diagnosis:   75 year-old gentleman with pT3 N1 M0 prostatic adenocarcinoma, Gleason Score 4+5, and pre-op PSA=9.81 and post-op PSA=0.02     Indication for treatment:  Curative, Salvage Prostatic Fossa Radiotherapy       Radiation treatment dates:   01/12/16-03/08/16  Site/dose:   The pelvic nodes were initially treated to 45 Gy and then the prostatic fossa was treated to 68.4 Gy in 38 fractions of 1.8 Gy  Beams/energy:   The nodes and the prostatic fossa was treated using helical intensity modulated radiotherapy delivering 6 megavolt photons. Image guidance was performed with megavoltage CT studies prior to each fraction. He was immobilized with a body fix lower extremity mold.  Narrative: The patient tolerated radiation treatment relatively well.   Weight and vitals stable. Denies pain. Reports nocturia x 2. Denies dysuria or hematuria. Denies leakage or incontinence. Describes a strong steady urine stream without difficulty emptying his bladder. Describes soft bowel movements with cramping that resolve with one Imodium tablet. Denies diarrhea.  Reports heavy physical activity causes SOB. Reports nausea has greatly improved.  Reports hot flashes continue. Reports new onset of chest congestion x 5 day. Completes treatment today thus one month follow up appointment card given. Scheduled to follow up at [facility] on 12/18 with labs. Scheduled for body and bone scan in February  Plan: The patient has completed radiation treatment. He will return to radiation oncology clinic for routine followup in one month. I advised him to call or return sooner if he has any questions or concerns related to his recovery or treatment. ________________________________  Sheral Apley. Tammi Klippel, M.D.

## 2016-03-13 ENCOUNTER — Ambulatory Visit: Payer: Medicare Other

## 2016-03-14 ENCOUNTER — Ambulatory Visit: Payer: Medicare Other

## 2016-03-15 ENCOUNTER — Ambulatory Visit: Payer: Medicare Other

## 2016-04-03 DIAGNOSIS — C61 Malignant neoplasm of prostate: Secondary | ICD-10-CM | POA: Diagnosis not present

## 2016-04-19 NOTE — Progress Notes (Signed)
  Mr. Jared Jacobs. 76  year old man is here for a one month follow up appointment for pT3 N1 M0 prostatic adenocarcinoma, Gleason Score 4+5, and pre-op PSA=9.81 and post-op PSA=0.02          PAIN: He is currently    URINARY: He reports  urinary frequency,urinary urgency, urinary retention, hesistency, has a stop and start pattern,it is hard to postpone urination, is having incontinence and dribbling.  He reports he gets up to urinate    times per night.  Hematuria, incomplete emptying of bladder , urinary retention or stress incontinence. BOWEL: He reports a  bowel movement everyday/everyother day  Diarrhea, formed stools, normal bowel movements. Fatigue:No Appetite:Good Weight: Wt Readings from Last 3 Encounters:  04/25/16 185 lb 3.2 oz (84 kg)  03/08/16 181 lb 12.8 oz (82.5 kg)  03/02/16 182 lb 9.6 oz (82.8 kg)  BP (!) 145/91   Pulse (!) 102   Temp 98 F (36.7 C) (Oral)   Resp 18   Ht 5\' 10"  (1.778 m)   Wt 185 lb 3.2 oz (84 kg)   SpO2 99%   BMI 26.57 kg/m  Urologist:Dr. Malon Kindle  Lupron injection:Dr. Malon Kindle at [facility] 04-03-16 every 3 months

## 2016-04-25 ENCOUNTER — Encounter: Payer: Self-pay | Admitting: Medical Oncology

## 2016-04-25 ENCOUNTER — Encounter: Payer: Self-pay | Admitting: Radiation Oncology

## 2016-04-25 ENCOUNTER — Ambulatory Visit
Admission: RE | Admit: 2016-04-25 | Discharge: 2016-04-25 | Disposition: A | Discharge status: Home / Self Care | Payer: Medicare Other | Source: Ambulatory Visit | Attending: Radiation Oncology | Admitting: Radiation Oncology

## 2016-04-25 VITALS — BP 145/91 | HR 102 | Temp 98.0°F | Resp 18 | Ht 70.0 in | Wt 185.2 lb

## 2016-04-25 DIAGNOSIS — Z79899 Other long term (current) drug therapy: Secondary | ICD-10-CM | POA: Insufficient documentation

## 2016-04-25 DIAGNOSIS — Z7982 Long term (current) use of aspirin: Secondary | ICD-10-CM | POA: Insufficient documentation

## 2016-04-25 DIAGNOSIS — C61 Malignant neoplasm of prostate: Secondary | ICD-10-CM | POA: Insufficient documentation

## 2016-04-25 NOTE — Progress Notes (Signed)
Radiation Oncology         [phone] ________________________________  Name: Jared Jacobs MRN: [id number]  Date: 04/25/2016  DOB: October 17, 1940  Post Treatment Note  CC: Jared Shire, MD  Jared Bring, MD  Diagnosis:   76 y.o. gentleman with pT3 N1 M0 prostatic adenocarcinoma, Gleason Score 4+5, and pre-op PSA=9.81 and post-op PSA=0.02.    Interval Since Last Radiation:  7 weeks   01/12/16-03/08/16: The pelvic nodes were initially treated to 45 Gy and then the prostatic fossa was treated to 68.4 Gy in 38 fractions of 1.8 Gy  Narrative:  The patient returns today for routine follow-up.  The patient continues on ADT with Lupron, and he follows with Dr[facility] at [facility]. His PSA when checked last month was <.1.                             On review of systems, the patient states he's doing well since radiotherapy. He's noted more bowel frequency but denies any diarrhea. He denies any concerns with urinary symptoms. He is fatigued, though this is improving, and is trying to work out more often each weak. He is interested in a new internal medicine physician as his previous physician has retired. No other complaints are noted.  ALLERGIES:  has No Known Allergies.  Meds: Current Outpatient Prescriptions  Medication Sig Dispense Refill  . amitriptyline (ELAVIL) 75 MG tablet Take 75 mg by mouth.    Marland Kitchen aspirin EC 81 MG tablet Take 81 mg by mouth. Reported on 11/02/2015    . atorvastatin (LIPITOR) 40 MG tablet Take 40 mg by mouth.    . esomeprazole (NEXIUM) 40 MG capsule Take by mouth.    Marland Kitchen LORazepam (ATIVAN) 2 MG tablet Reported on 11/02/2015    . Multiple Vitamin (MULTIVITAMIN) tablet Take 1 tablet by mouth daily.    . pantoprazole (PROTONIX) 40 MG tablet Take 40 mg by mouth. Reported on 11/02/2015    . prochlorperazine (COMPAZINE) 10 MG tablet Take 1 tablet (10 mg total) by mouth every 6 (six) hours as needed for nausea or vomiting. (Patient not taking: Reported on 04/25/2016) 30  tablet 5  . zolpidem (AMBIEN) 10 MG tablet Take 10 mg by mouth. Reported on 11/02/2015     No current facility-administered medications for this encounter.     Physical Findings:  height is 5\' 10"  (1.778 m) and weight is 185 lb 3.2 oz (84 kg). His oral temperature is 98 F (36.7 C). His blood pressure is 145/91 (abnormal) and his pulse is 102 (abnormal). His respiration is 18 and oxygen saturation is 99%.  In general this is a well appearing Caucasian male in no acute distress. He's alert and oriented x4 and appropriate throughout the examination. Cardiopulmonary assessment is negative for acute distress and he exhibits normal effort.   Lab Findings: Lab Results  Component Value Date   WBC 6.0 06/29/2015   HGB 11.1 (L) 07/02/2015   HCT 32.8 (L) 07/02/2015   MCV 97.1 06/29/2015   PLT 255 06/29/2015     Radiographic Findings: No results found.  Impression/Plan: 1. 76 y.o. gentleman with pT3 N1 M0 prostatic adenocarcinoma, Gleason Score 4+5, and pre-op PSA=9.81 and post-op PSA=0.02 . The patient has done well since completing his radiotherapy. He will continue to be followed in the urologic oncology clinic at [facility], and plans to see Dr.  Chryl Jacobs again in the summer. He will also follow with PSAs  locally with Dr. Alinda Jacobs.  We would be happy to see him back as needed moving forward if there are questions or concerns regarding his previous radiotherapy treatment. 2. Need for PCP. I have recommended that the patient be evaluated by internal medicine to establish his care. A referral is placed for him to meet with Dr. Ardeth Jacobs.     [facility], PAC

## 2016-04-25 NOTE — Progress Notes (Signed)
Mr. Coney state he is doing well from radiation standpoint. He was seen at [facility] in December and his PSA was undetectable. His energy has returned to baseline but continues to have hot flashes related to Lupron. He will need his next Lupron injection in March and he would like to get here in [place]. He will have the nurse navigator fax the orders to me and Dr. Alen Blew can place the order. I asked him to call me with any questions.

## 2016-05-22 ENCOUNTER — Telehealth: Payer: Self-pay | Admitting: Oncology

## 2016-05-22 ENCOUNTER — Other Ambulatory Visit: Payer: Self-pay | Admitting: Medical Oncology

## 2016-05-22 DIAGNOSIS — C61 Malignant neoplasm of prostate: Secondary | ICD-10-CM

## 2016-05-22 NOTE — Telephone Encounter (Signed)
lvm to inform ptof 3/6 lab/inj appt at 130 pm per LOS

## 2016-05-23 ENCOUNTER — Telehealth: Payer: Self-pay | Admitting: Medical Oncology

## 2016-05-23 NOTE — Progress Notes (Signed)
I spoke with [facility] to confirm his appointments for labs and Lupron injection 06/20/16. I will forward results to Sinclair Grooms, RN-research at [facility].

## 2016-06-12 DIAGNOSIS — Z79818 Long term (current) use of other agents affecting estrogen receptors and estrogen levels: Secondary | ICD-10-CM | POA: Diagnosis not present

## 2016-06-12 DIAGNOSIS — Z1389 Encounter for screening for other disorder: Secondary | ICD-10-CM | POA: Diagnosis not present

## 2016-06-12 DIAGNOSIS — K219 Gastro-esophageal reflux disease without esophagitis: Secondary | ICD-10-CM | POA: Diagnosis not present

## 2016-06-12 DIAGNOSIS — I7090 Unspecified atherosclerosis: Secondary | ICD-10-CM | POA: Diagnosis not present

## 2016-06-12 DIAGNOSIS — C61 Malignant neoplasm of prostate: Secondary | ICD-10-CM | POA: Diagnosis not present

## 2016-06-12 DIAGNOSIS — Z86010 Personal history of colonic polyps: Secondary | ICD-10-CM | POA: Diagnosis not present

## 2016-06-12 DIAGNOSIS — E784 Other hyperlipidemia: Secondary | ICD-10-CM | POA: Diagnosis not present

## 2016-06-12 DIAGNOSIS — Z6828 Body mass index (BMI) 28.0-28.9, adult: Secondary | ICD-10-CM | POA: Diagnosis not present

## 2016-06-20 ENCOUNTER — Other Ambulatory Visit: Payer: Medicare Other

## 2016-06-20 ENCOUNTER — Ambulatory Visit (HOSPITAL_BASED_OUTPATIENT_CLINIC_OR_DEPARTMENT_OTHER): Payer: Medicare Other

## 2016-06-20 VITALS — BP 128/84 | HR 90 | Temp 97.7°F | Resp 18

## 2016-06-20 DIAGNOSIS — Z5111 Encounter for antineoplastic chemotherapy: Secondary | ICD-10-CM

## 2016-06-20 DIAGNOSIS — C61 Malignant neoplasm of prostate: Secondary | ICD-10-CM

## 2016-06-20 LAB — COMPREHENSIVE METABOLIC PANEL
ALBUMIN: 4 g/dL (ref 3.5–5.0)
ALK PHOS: 112 U/L (ref 40–150)
ALT: 20 U/L (ref 0–55)
AST: 19 U/L (ref 5–34)
Anion Gap: 9 mEq/L (ref 3–11)
BILIRUBIN TOTAL: 0.54 mg/dL (ref 0.20–1.20)
BUN: 20.9 mg/dL (ref 7.0–26.0)
CALCIUM: 9.9 mg/dL (ref 8.4–10.4)
CO2: 24 mEq/L (ref 22–29)
Chloride: 109 mEq/L (ref 98–109)
Creatinine: 1.4 mg/dL — ABNORMAL HIGH (ref 0.7–1.3)
EGFR: 50 mL/min/{1.73_m2} — AB (ref >90)
GLUCOSE: 117 mg/dL (ref 70–140)
POTASSIUM: 4 meq/L (ref 3.5–5.1)
SODIUM: 142 meq/L (ref 136–145)
TOTAL PROTEIN: 7.2 g/dL (ref 6.4–8.3)

## 2016-06-20 LAB — CBC WITH DIFFERENTIAL/PLATELET
BASO%: 0.2 % (ref 0.0–2.0)
BASOS ABS: 0 10*3/uL (ref 0.0–0.1)
EOS ABS: 0.1 10*3/uL (ref 0.0–0.5)
EOS%: 1.5 % (ref 0.0–7.0)
HEMATOCRIT: 37.4 % — AB (ref 38.4–49.9)
HEMOGLOBIN: 12.3 g/dL — AB (ref 13.0–17.1)
LYMPH#: 1 10*3/uL (ref 0.9–3.3)
LYMPH%: 16.4 % (ref 14.0–49.0)
MCH: 30.9 pg (ref 27.2–33.4)
MCHC: 32.9 g/dL (ref 32.0–36.0)
MCV: 94 fL (ref 79.3–98.0)
MONO#: 0.5 10*3/uL (ref 0.1–0.9)
MONO%: 7.9 % (ref 0.0–14.0)
NEUT%: 74 % (ref 39.0–75.0)
NEUTROS ABS: 4.5 10*3/uL (ref 1.5–6.5)
Platelets: 200 10*3/uL (ref 140–400)
RBC: 3.98 10*6/uL — ABNORMAL LOW (ref 4.20–5.82)
RDW: 13.9 % (ref 11.0–14.6)
WBC: 6.1 10*3/uL (ref 4.0–10.3)

## 2016-06-20 MED ORDER — LEUPROLIDE ACETATE (4 MONTH) 30 MG IM KIT
22.5000 mg | PACK | Freq: Once | INTRAMUSCULAR | Status: AC
  Administered 2016-06-20: 22.5 mg via INTRAMUSCULAR
  Filled 2016-06-20: qty 30

## 2016-06-20 NOTE — Patient Instructions (Signed)
Leuprolide depot injection What is this medicine? LEUPROLIDE (loo PROE lide) is a man-made protein that acts like a natural hormone in the body. It decreases testosterone in men and decreases estrogen in women. In men, this medicine is used to treat advanced prostate cancer. In women, some forms of this medicine may be used to treat endometriosis, uterine fibroids, or other male hormone-related problems. This medicine may be used for other purposes; ask your health care provider or pharmacist if you have questions. COMMON BRAND NAME(S): Eligard, Lupron Depot, Lupron Depot-Ped, Viadur What should I tell my health care provider before I take this medicine? They need to know if you have any of these conditions: -diabetes -heart disease or previous heart attack -high blood pressure -high cholesterol -mental illness -osteoporosis -pain or difficulty passing urine -seizures -spinal cord metastasis -stroke -suicidal thoughts, plans, or attempt; a previous suicide attempt by you or a family member -tobacco smoker -unusual vaginal bleeding (women) -an unusual or allergic reaction to leuprolide, benzyl alcohol, other medicines, foods, dyes, or preservatives -pregnant or trying to get pregnant -breast-feeding How should I use this medicine? This medicine is for injection into a muscle or for injection under the skin. It is given by a health care professional in a hospital or clinic setting. The specific product will determine how it will be given to you. Make sure you understand which product you receive and how often you will receive it. Talk to your pediatrician regarding the use of this medicine in children. Special care may be needed. Overdosage: If you think you have taken too much of this medicine contact a poison control center or emergency room at once. NOTE: This medicine is only for you. Do not share this medicine with others. What if I miss a dose? It is important not to miss a dose.  Call your doctor or health care professional if you are unable to keep an appointment. Depot injections: Depot injections are given either once-monthly, every 12 weeks, every 16 weeks, or every 24 weeks depending on the product you are prescribed. The product you are prescribed will be based on if you are male or male, and your condition. Make sure you understand your product and dosing. What may interact with this medicine? Do not take this medicine with any of the following medications: -chasteberry This medicine may also interact with the following medications: -herbal or dietary supplements, like black cohosh or DHEA -male hormones, like estrogens or progestins and birth control pills, patches, rings, or injections -male hormones, like testosterone This list may not describe all possible interactions. Give your health care provider a list of all the medicines, herbs, non-prescription drugs, or dietary supplements you use. Also tell them if you smoke, drink alcohol, or use illegal drugs. Some items may interact with your medicine. What should I watch for while using this medicine? Visit your doctor or health care professional for regular checks on your progress. During the first weeks of treatment, your symptoms may get worse, but then will improve as you continue your treatment. You may get hot flashes, increased bone pain, increased difficulty passing urine, or an aggravation of nerve symptoms. Discuss these effects with your doctor or health care professional, some of them may improve with continued use of this medicine. Male patients may experience a menstrual cycle or spotting during the first months of therapy with this medicine. If this continues, contact your doctor or health care professional. What side effects may I notice from receiving this medicine? Side   effects that you should report to your doctor or health care professional as soon as possible: -allergic reactions like skin  rash, itching or hives, swelling of the face, lips, or tongue -breathing problems -chest pain -depression or memory disorders -pain in your legs or groin -pain at site where injected or implanted -seizures -severe headache -swelling of the feet and legs -suicidal thoughts or other mood changes -visual changes -vomiting Side effects that usually do not require medical attention (report to your doctor or health care professional if they continue or are bothersome): -breast swelling or tenderness -decrease in sex drive or performance -diarrhea -hot flashes -loss of appetite -muscle, joint, or bone pains -nausea -redness or irritation at site where injected or implanted -skin problems or acne This list may not describe all possible side effects. Call your doctor for medical advice about side effects. You may report side effects to FDA at 1-800-FDA-1088. Where should I keep my medicine? This drug is given in a hospital or clinic and will not be stored at home. NOTE: This sheet is a summary. It may not cover all possible information. If you have questions about this medicine, talk to your doctor, pharmacist, or health care provider.  2018 Elsevier/Gold Standard (2015-09-16 09:45:53)  

## 2016-06-21 ENCOUNTER — Encounter: Payer: Self-pay | Admitting: *Deleted

## 2016-06-21 LAB — PSA: Prostate Specific Ag, Serum: <0.1 ng/mL (ref 0.0–4.0)

## 2016-06-21 LAB — TESTOSTERONE: Testosterone, Serum: <3 ng/dL — ABNORMAL LOW (ref 264–916)

## 2016-06-21 NOTE — Progress Notes (Addendum)
69 Spoke with Mr. Annie Paras about his creatinine result that it was slightly high per Shona Simpson, PA. And his lab results from yesterday would be faxed to his PCP Dr. Velna Hatchet with [facility]. [place] labs to Dr. Naoma Diener office.

## 2016-06-22 ENCOUNTER — Encounter: Payer: Self-pay | Admitting: Medical Oncology

## 2016-06-22 NOTE — Progress Notes (Signed)
I faxed labs to Sinclair Grooms, RN, BSN- [employer]

## 2016-07-07 DIAGNOSIS — R946 Abnormal results of thyroid function studies: Secondary | ICD-10-CM | POA: Diagnosis not present

## 2016-07-07 DIAGNOSIS — E784 Other hyperlipidemia: Secondary | ICD-10-CM | POA: Diagnosis not present

## 2016-07-07 DIAGNOSIS — Z125 Encounter for screening for malignant neoplasm of prostate: Secondary | ICD-10-CM | POA: Diagnosis not present

## 2016-07-13 ENCOUNTER — Encounter: Payer: Self-pay | Admitting: Cardiology

## 2016-07-13 ENCOUNTER — Telehealth: Payer: Self-pay | Admitting: Cardiology

## 2016-07-13 DIAGNOSIS — Z6828 Body mass index (BMI) 28.0-28.9, adult: Secondary | ICD-10-CM | POA: Diagnosis not present

## 2016-07-13 DIAGNOSIS — Z86010 Personal history of colonic polyps: Secondary | ICD-10-CM | POA: Diagnosis not present

## 2016-07-13 DIAGNOSIS — Z79818 Long term (current) use of other agents affecting estrogen receptors and estrogen levels: Secondary | ICD-10-CM | POA: Diagnosis not present

## 2016-07-13 DIAGNOSIS — C61 Malignant neoplasm of prostate: Secondary | ICD-10-CM | POA: Diagnosis not present

## 2016-07-13 DIAGNOSIS — I7090 Unspecified atherosclerosis: Secondary | ICD-10-CM | POA: Diagnosis not present

## 2016-07-13 DIAGNOSIS — Z1212 Encounter for screening for malignant neoplasm of rectum: Secondary | ICD-10-CM | POA: Diagnosis not present

## 2016-07-13 DIAGNOSIS — E784 Other hyperlipidemia: Secondary | ICD-10-CM | POA: Diagnosis not present

## 2016-07-13 DIAGNOSIS — Z0000 Encounter for general adult medical examination without abnormal findings: Secondary | ICD-10-CM | POA: Diagnosis not present

## 2016-07-13 DIAGNOSIS — E038 Other specified hypothyroidism: Secondary | ICD-10-CM | POA: Diagnosis not present

## 2016-07-13 DIAGNOSIS — K219 Gastro-esophageal reflux disease without esophagitis: Secondary | ICD-10-CM | POA: Diagnosis not present

## 2016-07-13 NOTE — Telephone Encounter (Signed)
Received records from [facility] for appointment on 07/26/16 with Dr Percival Spanish.  Records put with Dr Hochrein's schedule for 07/26/16. lp

## 2016-07-25 NOTE — Progress Notes (Signed)
Cardiology Office Note   Date:  07/26/2016   ID:  Jared Jacobs, DOB 01-Feb-1941, MRN [id number]  PCP:  Jared Hatchet, MD  Cardiologist:   Jared Breeding, MD  Referring:  Jared Hatchet, MD  Chief Complaint  Patient presents with  . Shortness of Breath  . Coronary calcium      History of Present Illness: Jared Jacobs is a 76 y.o. male who presents for evaluation of coronary calcification and aortic atherosclerosis .  He has no past cardiac history. He has been treated recently for prostate cancer. He's been up to [facility] for management of this. He's currently on Lupron. He's noticed increasing shortness of breath when he is doing activities such as exercises in the gym that didn't bother him in the past. He's not been having any chest pressure, neck or arm discomfort. He's not noticed any palpitations, presyncope or syncope. He has no PND or orthopnea. His coronary calcium and atherosclerosis was an incidental finding on a CT. He otherwise has been active and has had no past cardiac complaints. He still exercises routinely.   Past Medical History:  Diagnosis Date  . GERD (gastroesophageal reflux disease)   . History of kidney stones   . History of skin cancer   . Hyperlipidemia   . IC (interstitial cystitis)   . Prostate cancer [facility])     Past Surgical History:  Procedure Laterality Date  . COLONOSCOPY  2014  . LYMPHADENECTOMY Bilateral 07/01/2015   Procedure: BILATERAL PELVIC LYMPHADENECTOMY;  Surgeon: Jared Bring, MD;  Location: [facility];  Service: Urology;  Laterality: Bilateral;  . NO PAST SURGERIES    . ROBOT ASSISTED LAPAROSCOPIC RADICAL PROSTATECTOMY N/A 07/01/2015   Procedure: XI ROBOTIC ASSISTED LAPAROSCOPIC RADICAL PROSTATECTOMY LEVEL 2;  Surgeon: Jared Bring, MD;  Location: [facility];  Service: Urology;  Laterality: N/A;     Current Outpatient Prescriptions  Medication Sig Dispense Refill  . amitriptyline (ELAVIL) 75 MG tablet Take 75 mg by mouth.      Marland Kitchen aspirin EC 81 MG tablet Take 81 mg by mouth. Reported on 11/02/2015    . atorvastatin (LIPITOR) 40 MG tablet Take 40 mg by mouth.    . esomeprazole (NEXIUM) 40 MG capsule Take by mouth.    . Multiple Vitamin (MULTIVITAMIN) tablet Take 1 tablet by mouth daily.    . pantoprazole (PROTONIX) 40 MG tablet Take 40 mg by mouth. Reported on 11/02/2015    . venlafaxine (EFFEXOR) 37.5 MG tablet     . zolpidem (AMBIEN) 10 MG tablet Take 10 mg by mouth. Reported on 11/02/2015     No current facility-administered medications for this visit.     Allergies:   Patient has no known allergies.    Social History:  The patient  reports that he has never smoked. He has never used smokeless tobacco. He reports that he drinks alcohol. He reports that he does not use drugs.   Family History:  The patient's no history of CAD.  There is family history includes Cancer in his brother, father, and mother.    ROS:  Please see the history of present illness.   Otherwise, review of systems are positive for none.   All other systems are reviewed and negative.    PHYSICAL EXAM: VS:  BP 130/84   Pulse 91   Ht 5\' 8"  (1.727 m)   Wt 188 lb (85.3 kg)   BMI 28.59 kg/m  , BMI Body mass index is 28.59 kg/m. GENERAL:  Well appearing HEENT:  Pupils equal round and reactive, fundi not visualized, oral mucosa unremarkable NECK:  No jugular venous distention, waveform within normal limits, carotid upstroke brisk and symmetric, no bruits, no thyromegaly LYMPHATICS:  No cervical, inguinal adenopathy LUNGS:  Clear to auscultation bilaterally BACK:  No CVA tenderness CHEST:  Unremarkable HEART:  PMI not displaced or sustained,S1 and S2 within normal limits, no S3, no S4, no clicks, no rubs, no murmurs ABD:  Flat, positive bowel sounds normal in frequency in pitch, no bruits, no rebound, no guarding, no midline pulsatile mass, no hepatomegaly, no splenomegaly EXT:  2 plus pulses throughout, no edema, no cyanosis no  clubbing SKIN:  No rashes no nodules NEURO:  Cranial nerves II through XII grossly intact, motor grossly intact throughout PSYCH:  Cognitively intact, oriented to person place and time    EKG:  EKG is ordered today. The ekg ordered today demonstrates sinus rhythm rate 91, leftward axis, low voltage in the limb leads, poor anterior R wave progression.     Recent Labs: 06/20/2016: ALT 20; BUN 20.9; Creatinine 1.4; HGB 12.3; Platelets 200; Potassium 4.0; Sodium 142    Lipid Panel No results found for: CHOL, TRIG, HDL, CHOLHDL, VLDL, LDLCALC, LDLDIRECT    Wt Readings from Last 3 Encounters:  07/26/16 188 lb (85.3 kg)  04/25/16 185 lb 3.2 oz (84 kg)  03/08/16 181 lb 12.8 oz (82.5 kg)      Other studies Reviewed: Additional studies/ records that were reviewed today include: Office records. Review of the above records demonstrates:  Please see elsewhere in the note.     ASSESSMENT AND PLAN:  CORONARY CALCIFICATION:  I don't strongly suspect obstructive coronary disease. I'm going to check a POET (Plain Old Exercise Treadmill)  AORTIC ATHEROSCLEROSIS:  This will be evaluated as above.  He is on statin.  No change in therapy is planned.   ABNORMAL EKG:  He does have some low voltage on his EKG. Some shortness of breath. Is a mention of cardiomegaly in a CT. I will start with a BNP level. I'll have a low threshold for an echocardiogram.   Current medicines are reviewed at length with the patient today.  The patient does not have concerns regarding medicines.  The following changes have been made:  no change  Labs/ tests ordered today include:   Orders Placed This Encounter  Procedures  . EXERCISE TOLERANCE TEST  . EKG 12-Lead     Disposition:   FU with me as needed.      Signed, Jared Breeding, MD  07/26/2016 10:20 AM    [facility]

## 2016-07-26 ENCOUNTER — Encounter: Payer: Self-pay | Admitting: Cardiology

## 2016-07-26 ENCOUNTER — Ambulatory Visit (INDEPENDENT_AMBULATORY_CARE_PROVIDER_SITE_OTHER): Payer: Medicare Other | Admitting: Cardiology

## 2016-07-26 VITALS — BP 130/84 | HR 91 | Ht 68.0 in | Wt 188.0 lb

## 2016-07-26 DIAGNOSIS — R931 Abnormal findings on diagnostic imaging of heart and coronary circulation: Secondary | ICD-10-CM | POA: Diagnosis not present

## 2016-07-26 DIAGNOSIS — E038 Other specified hypothyroidism: Secondary | ICD-10-CM | POA: Diagnosis not present

## 2016-07-26 DIAGNOSIS — R9431 Abnormal electrocardiogram [ECG] [EKG]: Secondary | ICD-10-CM

## 2016-07-26 DIAGNOSIS — I2510 Atherosclerotic heart disease of native coronary artery without angina pectoris: Secondary | ICD-10-CM

## 2016-07-26 DIAGNOSIS — R0600 Dyspnea, unspecified: Secondary | ICD-10-CM

## 2016-07-26 NOTE — Patient Instructions (Signed)
Medication Instructions:  Continue current medciations  Labwork: None Ordered  Testing/Procedures: Your physician has requested that you have an exercise tolerance test. For further information please visit [email or web address]. Please also follow instruction sheet, as given.  Follow-Up: Your physician recommends that you schedule a follow-up appointment in: As Needed   Any Other Special Instructions Will Be Listed Below (If Applicable).   If you need a refill on your cardiac medications before your next appointment, please call your pharmacy.

## 2016-07-27 ENCOUNTER — Telehealth (HOSPITAL_COMMUNITY): Payer: Self-pay

## 2016-07-27 NOTE — Telephone Encounter (Signed)
Encounter complete. 

## 2016-07-28 ENCOUNTER — Encounter (HOSPITAL_COMMUNITY): Payer: Medicare Other

## 2016-07-28 ENCOUNTER — Ambulatory Visit (HOSPITAL_COMMUNITY)
Admission: RE | Admit: 2016-07-28 | Discharge: 2016-07-28 | Disposition: A | Discharge status: Home / Self Care | Payer: Medicare Other | Source: Ambulatory Visit | Attending: Cardiology | Admitting: Cardiology

## 2016-07-28 DIAGNOSIS — R931 Abnormal findings on diagnostic imaging of heart and coronary circulation: Secondary | ICD-10-CM | POA: Insufficient documentation

## 2016-07-28 LAB — EXERCISE TOLERANCE TEST
CHL CUP RESTING HR STRESS: 99 {beats}/min
CSEPEW: 11.1 METS
CSEPPHR: 139 {beats}/min
Exercise duration (min): 9 min
Exercise duration (sec): 39 s
MPHR: 147 {beats}/min
Percent HR: 94 %
RPE: 17

## 2016-09-18 DIAGNOSIS — Z006 Encounter for examination for normal comparison and control in clinical research program: Secondary | ICD-10-CM | POA: Diagnosis not present

## 2016-09-18 DIAGNOSIS — C61 Malignant neoplasm of prostate: Secondary | ICD-10-CM | POA: Diagnosis not present

## 2016-11-20 ENCOUNTER — Encounter: Payer: Self-pay | Admitting: Medical Oncology

## 2016-11-21 ENCOUNTER — Encounter: Payer: Self-pay | Admitting: Medical Oncology

## 2016-11-21 ENCOUNTER — Telehealth: Payer: Self-pay | Admitting: Oncology

## 2016-11-21 ENCOUNTER — Other Ambulatory Visit: Payer: Self-pay | Admitting: Medical Oncology

## 2016-11-21 DIAGNOSIS — C61 Malignant neoplasm of prostate: Secondary | ICD-10-CM

## 2016-11-21 NOTE — Progress Notes (Signed)
Jared Jacobs sent me an e-mail stating that it is time to get labs drawn and receive his Lupron for the clinical trial he is enrolled in at [facility]. I informed him that I will contact Sinclair Grooms, RN for orders.

## 2016-11-21 NOTE — Telephone Encounter (Signed)
lvm to inform pt of 9/5 appt at 2 pm per sch msg

## 2016-11-21 NOTE — Progress Notes (Signed)
Received orders from [facility] for labs and Lupron. POF made for these to be scheduled. Mr. Jared Jacobs notified of above.

## 2016-12-20 ENCOUNTER — Ambulatory Visit (HOSPITAL_BASED_OUTPATIENT_CLINIC_OR_DEPARTMENT_OTHER): Payer: Medicare Other

## 2016-12-20 ENCOUNTER — Other Ambulatory Visit: Payer: Medicare Other

## 2016-12-20 DIAGNOSIS — C61 Malignant neoplasm of prostate: Secondary | ICD-10-CM | POA: Diagnosis not present

## 2016-12-20 DIAGNOSIS — Z5111 Encounter for antineoplastic chemotherapy: Secondary | ICD-10-CM

## 2016-12-20 LAB — COMPREHENSIVE METABOLIC PANEL
ALT: 17 U/L (ref 0–55)
ANION GAP: 9 meq/L (ref 3–11)
AST: 18 U/L (ref 5–34)
Albumin: 3.9 g/dL (ref 3.5–5.0)
Alkaline Phosphatase: 121 U/L (ref 40–150)
BUN: 16.6 mg/dL (ref 7.0–26.0)
CHLORIDE: 107 meq/L (ref 98–109)
CO2: 23 meq/L (ref 22–29)
Calcium: 10.2 mg/dL (ref 8.4–10.4)
Creatinine: 1.3 mg/dL (ref 0.7–1.3)
EGFR: 55 mL/min/{1.73_m2} — AB (ref >90)
Glucose: 95 mg/dl (ref 70–140)
POTASSIUM: 3.9 meq/L (ref 3.5–5.1)
SODIUM: 140 meq/L (ref 136–145)
Total Bilirubin: 0.38 mg/dL (ref 0.20–1.20)
Total Protein: 7.6 g/dL (ref 6.4–8.3)

## 2016-12-20 LAB — CBC WITH DIFFERENTIAL/PLATELET
BASO%: 0.4 % (ref 0.0–2.0)
BASOS ABS: 0 10*3/uL (ref 0.0–0.1)
EOS%: 2 % (ref 0.0–7.0)
Eosinophils Absolute: 0.1 10*3/uL (ref 0.0–0.5)
HCT: 39.9 % (ref 38.4–49.9)
HGB: 13.4 g/dL (ref 13.0–17.1)
LYMPH#: 1.2 10*3/uL (ref 0.9–3.3)
LYMPH%: 23.3 % (ref 14.0–49.0)
MCH: 31.6 pg (ref 27.2–33.4)
MCHC: 33.6 g/dL (ref 32.0–36.0)
MCV: 94.1 fL (ref 79.3–98.0)
MONO#: 0.5 10*3/uL (ref 0.1–0.9)
MONO%: 10.2 % (ref 0.0–14.0)
NEUT#: 3.2 10*3/uL (ref 1.5–6.5)
NEUT%: 64.1 % (ref 39.0–75.0)
PLATELETS: 251 10*3/uL (ref 140–400)
RBC: 4.24 10*6/uL (ref 4.20–5.82)
RDW: 13.3 % (ref 11.0–14.6)
WBC: 5 10*3/uL (ref 4.0–10.3)

## 2016-12-20 MED ORDER — LEUPROLIDE ACETATE (4 MONTH) 30 MG IM KIT
22.5000 mg | PACK | Freq: Once | INTRAMUSCULAR | Status: AC
  Administered 2016-12-20: 22.5 mg via INTRAMUSCULAR
  Filled 2016-12-20: qty 30

## 2016-12-20 NOTE — Patient Instructions (Signed)
Leuprolide depot injection What is this medicine? LEUPROLIDE (loo PROE lide) is a man-made protein that acts like a natural hormone in the body. It decreases testosterone in men and decreases estrogen in women. In men, this medicine is used to treat advanced prostate cancer. In women, some forms of this medicine may be used to treat endometriosis, uterine fibroids, or other male hormone-related problems. This medicine may be used for other purposes; ask your health care provider or pharmacist if you have questions. COMMON BRAND NAME(S): Eligard, Lupron Depot, Lupron Depot-Ped, Viadur What should I tell my health care provider before I take this medicine? They need to know if you have any of these conditions: -diabetes -heart disease or previous heart attack -high blood pressure -high cholesterol -mental illness -osteoporosis -pain or difficulty passing urine -seizures -spinal cord metastasis -stroke -suicidal thoughts, plans, or attempt; a previous suicide attempt by you or a family member -tobacco smoker -unusual vaginal bleeding (women) -an unusual or allergic reaction to leuprolide, benzyl alcohol, other medicines, foods, dyes, or preservatives -pregnant or trying to get pregnant -breast-feeding How should I use this medicine? This medicine is for injection into a muscle or for injection under the skin. It is given by a health care professional in a hospital or clinic setting. The specific product will determine how it will be given to you. Make sure you understand which product you receive and how often you will receive it. Talk to your pediatrician regarding the use of this medicine in children. Special care may be needed. Overdosage: If you think you have taken too much of this medicine contact a poison control center or emergency room at once. NOTE: This medicine is only for you. Do not share this medicine with others. What if I miss a dose? It is important not to miss a dose.  Call your doctor or health care professional if you are unable to keep an appointment. Depot injections: Depot injections are given either once-monthly, every 12 weeks, every 16 weeks, or every 24 weeks depending on the product you are prescribed. The product you are prescribed will be based on if you are male or male, and your condition. Make sure you understand your product and dosing. What may interact with this medicine? Do not take this medicine with any of the following medications: -chasteberry This medicine may also interact with the following medications: -herbal or dietary supplements, like black cohosh or DHEA -male hormones, like estrogens or progestins and birth control pills, patches, rings, or injections -male hormones, like testosterone This list may not describe all possible interactions. Give your health care provider a list of all the medicines, herbs, non-prescription drugs, or dietary supplements you use. Also tell them if you smoke, drink alcohol, or use illegal drugs. Some items may interact with your medicine. What should I watch for while using this medicine? Visit your doctor or health care professional for regular checks on your progress. During the first weeks of treatment, your symptoms may get worse, but then will improve as you continue your treatment. You may get hot flashes, increased bone pain, increased difficulty passing urine, or an aggravation of nerve symptoms. Discuss these effects with your doctor or health care professional, some of them may improve with continued use of this medicine. Male patients may experience a menstrual cycle or spotting during the first months of therapy with this medicine. If this continues, contact your doctor or health care professional. What side effects may I notice from receiving this medicine? Side   effects that you should report to your doctor or health care professional as soon as possible: -allergic reactions like skin  rash, itching or hives, swelling of the face, lips, or tongue -breathing problems -chest pain -depression or memory disorders -pain in your legs or groin -pain at site where injected or implanted -seizures -severe headache -swelling of the feet and legs -suicidal thoughts or other mood changes -visual changes -vomiting Side effects that usually do not require medical attention (report to your doctor or health care professional if they continue or are bothersome): -breast swelling or tenderness -decrease in sex drive or performance -diarrhea -hot flashes -loss of appetite -muscle, joint, or bone pains -nausea -redness or irritation at site where injected or implanted -skin problems or acne This list may not describe all possible side effects. Call your doctor for medical advice about side effects. You may report side effects to FDA at 1-800-FDA-1088. Where should I keep my medicine? This drug is given in a hospital or clinic and will not be stored at home. NOTE: This sheet is a summary. It may not cover all possible information. If you have questions about this medicine, talk to your doctor, pharmacist, or health care provider.  2018 Elsevier/Gold Standard (2015-09-16 09:45:53)  

## 2016-12-21 ENCOUNTER — Encounter: Payer: Self-pay | Admitting: Medical Oncology

## 2016-12-21 LAB — PSA

## 2016-12-21 LAB — TESTOSTERONE

## 2017-01-09 DIAGNOSIS — G608 Other hereditary and idiopathic neuropathies: Secondary | ICD-10-CM | POA: Diagnosis not present

## 2017-01-09 DIAGNOSIS — Z6827 Body mass index (BMI) 27.0-27.9, adult: Secondary | ICD-10-CM | POA: Diagnosis not present

## 2017-01-09 DIAGNOSIS — L570 Actinic keratosis: Secondary | ICD-10-CM | POA: Diagnosis not present

## 2017-01-09 DIAGNOSIS — Z23 Encounter for immunization: Secondary | ICD-10-CM | POA: Diagnosis not present

## 2017-04-02 DIAGNOSIS — C61 Malignant neoplasm of prostate: Secondary | ICD-10-CM | POA: Diagnosis not present

## 2017-04-02 DIAGNOSIS — Z006 Encounter for examination for normal comparison and control in clinical research program: Secondary | ICD-10-CM | POA: Diagnosis not present

## 2017-04-12 DIAGNOSIS — N200 Calculus of kidney: Secondary | ICD-10-CM | POA: Diagnosis not present

## 2017-04-12 DIAGNOSIS — R1084 Generalized abdominal pain: Secondary | ICD-10-CM | POA: Diagnosis not present

## 2017-06-25 DIAGNOSIS — C61 Malignant neoplasm of prostate: Secondary | ICD-10-CM | POA: Diagnosis not present

## 2017-06-25 DIAGNOSIS — Z006 Encounter for examination for normal comparison and control in clinical research program: Secondary | ICD-10-CM | POA: Diagnosis not present

## 2017-06-28 ENCOUNTER — Encounter: Payer: Self-pay | Admitting: Medical Oncology

## 2017-06-29 ENCOUNTER — Other Ambulatory Visit: Payer: Self-pay | Admitting: Medical Oncology

## 2017-06-29 ENCOUNTER — Telehealth: Payer: Self-pay | Admitting: *Deleted

## 2017-06-29 DIAGNOSIS — C61 Malignant neoplasm of prostate: Secondary | ICD-10-CM

## 2017-06-29 NOTE — Progress Notes (Signed)
Jared Jacobs remains in clinical trial at [facility] for prostate cancer. He needs to schedule labs and scans here in [place] for April. Ok per Shona Simpson, PA to order. I notified patient he will receive a call from scheduling.

## 2017-06-29 NOTE — Progress Notes (Signed)
Opened in error

## 2017-06-29 NOTE — Telephone Encounter (Signed)
CALLED PATIENT TO ASK ABOUT COMING IN FOR A FU APPT. WITH ALISON PERKINS, SPOKE WITH PATIENT AND HE AGREED TO COME IN ON 07-26-17 @ 8:30 AM

## 2017-07-09 ENCOUNTER — Encounter: Payer: Self-pay | Admitting: Medical Oncology

## 2017-07-09 DIAGNOSIS — R82998 Other abnormal findings in urine: Secondary | ICD-10-CM | POA: Diagnosis not present

## 2017-07-09 DIAGNOSIS — E785 Hyperlipidemia, unspecified: Secondary | ICD-10-CM | POA: Diagnosis not present

## 2017-07-09 DIAGNOSIS — E039 Hypothyroidism, unspecified: Secondary | ICD-10-CM | POA: Diagnosis not present

## 2017-07-09 NOTE — Progress Notes (Signed)
Scheduled labs and scans per clinical trial at [facility]. Emailed patient with appointments and instructions.

## 2017-07-16 ENCOUNTER — Ambulatory Visit
Admission: RE | Admit: 2017-07-16 | Discharge: 2017-07-16 | Disposition: A | Discharge status: Home / Self Care | Payer: Medicare Other | Source: Ambulatory Visit | Attending: Radiation Oncology | Admitting: Radiation Oncology

## 2017-07-16 DIAGNOSIS — Z1389 Encounter for screening for other disorder: Secondary | ICD-10-CM | POA: Diagnosis not present

## 2017-07-16 DIAGNOSIS — N3010 Interstitial cystitis (chronic) without hematuria: Secondary | ICD-10-CM | POA: Diagnosis not present

## 2017-07-16 DIAGNOSIS — C61 Malignant neoplasm of prostate: Secondary | ICD-10-CM | POA: Diagnosis not present

## 2017-07-16 DIAGNOSIS — Z6826 Body mass index (BMI) 26.0-26.9, adult: Secondary | ICD-10-CM | POA: Diagnosis not present

## 2017-07-16 DIAGNOSIS — E039 Hypothyroidism, unspecified: Secondary | ICD-10-CM | POA: Diagnosis not present

## 2017-07-16 DIAGNOSIS — Z79818 Long term (current) use of other agents affecting estrogen receptors and estrogen levels: Secondary | ICD-10-CM | POA: Diagnosis not present

## 2017-07-16 DIAGNOSIS — E7849 Other hyperlipidemia: Secondary | ICD-10-CM | POA: Diagnosis not present

## 2017-07-16 DIAGNOSIS — K219 Gastro-esophageal reflux disease without esophagitis: Secondary | ICD-10-CM | POA: Diagnosis not present

## 2017-07-16 DIAGNOSIS — Z0000 Encounter for general adult medical examination without abnormal findings: Secondary | ICD-10-CM | POA: Diagnosis not present

## 2017-07-16 DIAGNOSIS — I7090 Unspecified atherosclerosis: Secondary | ICD-10-CM | POA: Diagnosis not present

## 2017-07-16 DIAGNOSIS — Z86010 Personal history of colonic polyps: Secondary | ICD-10-CM | POA: Diagnosis not present

## 2017-07-16 LAB — CBC WITH DIFFERENTIAL (CANCER CENTER ONLY)
BASOS ABS: 0 10*3/uL (ref 0.0–0.1)
BASOS PCT: 1 %
Eosinophils Absolute: 0.1 10*3/uL (ref 0.0–0.5)
Eosinophils Relative: 1 %
HEMATOCRIT: 38.1 % — AB (ref 38.4–49.9)
HEMOGLOBIN: 12.6 g/dL — AB (ref 13.0–17.1)
LYMPHS PCT: 21 %
Lymphs Abs: 0.9 10*3/uL (ref 0.9–3.3)
MCH: 31.2 pg (ref 27.2–33.4)
MCHC: 33.1 g/dL (ref 32.0–36.0)
MCV: 94.3 fL (ref 79.3–98.0)
MONO ABS: 0.4 10*3/uL (ref 0.1–0.9)
Monocytes Relative: 9 %
NEUTROS ABS: 3 10*3/uL (ref 1.5–6.5)
NEUTROS PCT: 68 %
Platelet Count: 258 10*3/uL (ref 140–400)
RBC: 4.04 MIL/uL — AB (ref 4.20–5.82)
RDW: 14.3 % (ref 11.0–14.6)
WBC: 4.4 10*3/uL (ref 4.0–10.3)

## 2017-07-16 LAB — CMP (CANCER CENTER ONLY)
ALBUMIN: 3.8 g/dL (ref 3.5–5.0)
ALT: 17 U/L (ref 0–55)
ANION GAP: 8 (ref 3–11)
AST: 16 U/L (ref 5–34)
Alkaline Phosphatase: 116 U/L (ref 40–150)
BUN: 12 mg/dL (ref 7–26)
CHLORIDE: 109 mmol/L (ref 98–109)
CO2: 26 mmol/L (ref 22–29)
CREATININE: 1.24 mg/dL (ref 0.70–1.30)
Calcium: 9.9 mg/dL (ref 8.4–10.4)
GFR, Estimated: 56 mL/min — ABNORMAL LOW (ref >60)
GLUCOSE: 102 mg/dL (ref 70–140)
Potassium: 4.1 mmol/L (ref 3.5–5.1)
SODIUM: 143 mmol/L (ref 136–145)
Total Bilirubin: 0.3 mg/dL (ref 0.2–1.2)
Total Protein: 7.2 g/dL (ref 6.4–8.3)

## 2017-07-17 ENCOUNTER — Other Ambulatory Visit: Payer: Self-pay | Admitting: Radiation Oncology

## 2017-07-17 DIAGNOSIS — C61 Malignant neoplasm of prostate: Secondary | ICD-10-CM

## 2017-07-17 LAB — TESTOSTERONE: Testosterone: <3 ng/dL — ABNORMAL LOW (ref 264–916)

## 2017-07-17 LAB — PROSTATE-SPECIFIC AG, SERUM (LABCORP): Prostate Specific Ag, Serum: <0.1 ng/mL (ref 0.0–4.0)

## 2017-07-18 ENCOUNTER — Encounter (HOSPITAL_COMMUNITY): Payer: Self-pay

## 2017-07-18 ENCOUNTER — Ambulatory Visit (HOSPITAL_COMMUNITY)
Admission: RE | Admit: 2017-07-18 | Discharge: 2017-07-18 | Disposition: A | Discharge status: Home / Self Care | Payer: Medicare Other | Source: Ambulatory Visit | Attending: Radiation Oncology | Admitting: Radiation Oncology

## 2017-07-18 DIAGNOSIS — I2510 Atherosclerotic heart disease of native coronary artery without angina pectoris: Secondary | ICD-10-CM | POA: Insufficient documentation

## 2017-07-18 DIAGNOSIS — C61 Malignant neoplasm of prostate: Secondary | ICD-10-CM | POA: Diagnosis not present

## 2017-07-18 DIAGNOSIS — I700 Atherosclerosis of aorta: Secondary | ICD-10-CM | POA: Diagnosis not present

## 2017-07-18 DIAGNOSIS — J984 Other disorders of lung: Secondary | ICD-10-CM | POA: Diagnosis not present

## 2017-07-18 DIAGNOSIS — N132 Hydronephrosis with renal and ureteral calculous obstruction: Secondary | ICD-10-CM | POA: Insufficient documentation

## 2017-07-18 DIAGNOSIS — Z9079 Acquired absence of other genital organ(s): Secondary | ICD-10-CM | POA: Diagnosis not present

## 2017-07-18 DIAGNOSIS — I517 Cardiomegaly: Secondary | ICD-10-CM | POA: Insufficient documentation

## 2017-07-18 DIAGNOSIS — K5730 Diverticulosis of large intestine without perforation or abscess without bleeding: Secondary | ICD-10-CM | POA: Insufficient documentation

## 2017-07-18 DIAGNOSIS — Z1212 Encounter for screening for malignant neoplasm of rectum: Secondary | ICD-10-CM | POA: Diagnosis not present

## 2017-07-18 DIAGNOSIS — Z5111 Encounter for antineoplastic chemotherapy: Secondary | ICD-10-CM | POA: Diagnosis not present

## 2017-07-18 MED ORDER — IOPAMIDOL (ISOVUE-300) INJECTION 61%
100.0000 mL | Freq: Once | INTRAVENOUS | Status: AC | PRN
  Administered 2017-07-18: 100 mL via INTRAVENOUS

## 2017-07-18 MED ORDER — IOPAMIDOL (ISOVUE-300) INJECTION 61%
INTRAVENOUS | Status: AC
  Filled 2017-07-18: qty 100

## 2017-07-19 ENCOUNTER — Encounter: Payer: Self-pay | Admitting: Medical Oncology

## 2017-07-19 NOTE — Progress Notes (Signed)
Labs and CT of chest abdomen and pelvis faxed to Sinclair Grooms, RN at [facility]. Fax [phone].

## 2017-07-24 DIAGNOSIS — H524 Presbyopia: Secondary | ICD-10-CM | POA: Diagnosis not present

## 2017-07-24 DIAGNOSIS — H52203 Unspecified astigmatism, bilateral: Secondary | ICD-10-CM | POA: Diagnosis not present

## 2017-07-24 DIAGNOSIS — H2513 Age-related nuclear cataract, bilateral: Secondary | ICD-10-CM | POA: Diagnosis not present

## 2017-07-24 DIAGNOSIS — H43813 Vitreous degeneration, bilateral: Secondary | ICD-10-CM | POA: Diagnosis not present

## 2017-07-26 ENCOUNTER — Ambulatory Visit: Payer: Self-pay | Admitting: Radiation Oncology

## 2017-08-06 DIAGNOSIS — N201 Calculus of ureter: Secondary | ICD-10-CM | POA: Diagnosis not present

## 2017-08-06 DIAGNOSIS — N202 Calculus of kidney with calculus of ureter: Secondary | ICD-10-CM | POA: Diagnosis not present

## 2017-08-08 ENCOUNTER — Other Ambulatory Visit: Payer: Self-pay | Admitting: Urology

## 2017-08-09 ENCOUNTER — Other Ambulatory Visit: Payer: Self-pay | Admitting: Medical Oncology

## 2017-08-09 DIAGNOSIS — C61 Malignant neoplasm of prostate: Secondary | ICD-10-CM

## 2017-08-10 DIAGNOSIS — N1330 Unspecified hydronephrosis: Secondary | ICD-10-CM | POA: Diagnosis not present

## 2017-08-10 DIAGNOSIS — C61 Malignant neoplasm of prostate: Secondary | ICD-10-CM | POA: Diagnosis not present

## 2017-08-16 ENCOUNTER — Encounter (HOSPITAL_COMMUNITY): Payer: Self-pay

## 2017-08-16 NOTE — Patient Instructions (Addendum)
Your procedure is scheduled on: Monday, Aug 27, 2017   Report to [facility] Main  Entrance    Report to admitting at 1:30 PM   Call this number if you have problems the morning of surgery [phone]   Do not eat food:After Midnight. May have liquids until 9:30AM morning of surgery   Do NOT smoke after Midnight    CLEAR LIQUID DIET   Foods Allowed                                                                     Foods Excluded  Coffee and tea, regular and decaf                             liquids that you cannot  Plain Jell-O in any flavor                                             see through such as: Fruit ices (not with fruit pulp)                                     milk, soups, orange juice  Iced Popsicles                                    All solid food Carbonated beverages, regular and diet                                    Cranberry, grape and apple juices Sports drinks like Gatorade Lightly seasoned clear broth or consume(fat free) Sugar, honey syrup  Sample Menu Breakfast                                Lunch                                     Supper Cranberry juice                    Beef broth                            Chicken broth Jell-O                                     Grape juice                           Apple juice Coffee or tea  Jell-O                                      Popsicle                                                Coffee or tea                        Coffee or tea    Take these medicines the morning of surgery with A SIP OF WATER: Pantoprazole                               You may not have any metal on your body including jewelry, and body piercings             Do not wear lotions, powders, perfumes/cologne, or deodorant                          Men may shave face and neck.   Do not bring valuables to the hospital. [place].   Contacts,  dentures or bridgework may not be worn into surgery.   Patients discharged the day of surgery will not be allowed to drive home.   Name and phone number of your driver:               Please read over the following fact sheets you were given:  [facility] - Preparing for Surgery Before surgery, you can play an important role.  Because skin is not sterile, your skin needs to be as free of germs as possible.  You can reduce the number of germs on your skin by washing with CHG (chlorahexidine gluconate) soap before surgery.  CHG is an antiseptic cleaner which kills germs and bonds with the skin to continue killing germs even after washing. Please DO NOT use if you have an allergy to CHG or antibacterial soaps.  If your skin becomes reddened/irritated stop using the CHG and inform your nurse when you arrive at Short Stay. Do not shave (including legs and underarms) for at least 48 hours prior to the first CHG shower.  You may shave your face/neck.  Please follow these instructions carefully:  1.  Shower with CHG Soap the night before surgery and the  morning of surgery.  2.  If you choose to wash your hair, wash your hair first as usual with your normal  shampoo.  3.  After you shampoo, rinse your hair and body thoroughly to remove the shampoo.                             4.  Use CHG as you would any other liquid soap.  You can apply chg directly to the skin and wash.  Gently with a scrungie or clean washcloth.  5.  Apply the CHG Soap to your body ONLY FROM THE NECK DOWN.   Do  not use on face/ open  Wound or open sores. Avoid contact with eyes, ears mouth and genitals (private parts).                       Wash face,  Genitals (private parts) with your normal soap.             6.  Wash thoroughly, paying special attention to the area where your surgery  will be performed.  7.  Thoroughly rinse your body with warm water from the neck down.  8.  DO NOT shower/wash with your  normal soap after using and rinsing off the CHG Soap.                9.  Pat yourself dry with a clean towel.            10.  Wear clean pajamas.            11.  Place clean sheets on your bed the night of your first shower and do not  sleep with pets. Day of Surgery : Do not apply any lotions/deodorants the morning of surgery.  Please wear clean clothes to the hospital/surgery center.  FAILURE TO FOLLOW THESE INSTRUCTIONS MAY RESULT IN THE CANCELLATION OF YOUR SURGERY  PATIENT SIGNATURE_________________________________  NURSE SIGNATURE__________________________________  ________________________________________________________________________

## 2017-08-16 NOTE — Pre-Procedure Instructions (Signed)
Last office note Dr. Percival Spanish 07/26/2016 in epic.

## 2017-08-17 ENCOUNTER — Other Ambulatory Visit: Payer: Self-pay | Admitting: Medical Oncology

## 2017-08-17 DIAGNOSIS — C61 Malignant neoplasm of prostate: Secondary | ICD-10-CM

## 2017-08-20 ENCOUNTER — Encounter (HOSPITAL_COMMUNITY): Payer: Medicare Other

## 2017-08-20 ENCOUNTER — Encounter (HOSPITAL_COMMUNITY)
Admission: RE | Admit: 2017-08-20 | Discharge: 2017-08-20 | Disposition: A | Discharge status: Home / Self Care | Payer: Medicare Other | Source: Ambulatory Visit | Attending: Urology | Admitting: Urology

## 2017-08-20 DIAGNOSIS — C61 Malignant neoplasm of prostate: Secondary | ICD-10-CM | POA: Diagnosis not present

## 2017-08-20 DIAGNOSIS — N1330 Unspecified hydronephrosis: Secondary | ICD-10-CM | POA: Diagnosis not present

## 2017-08-20 MED ORDER — TECHNETIUM TC 99M MEDRONATE IV KIT
20.0000 | PACK | Freq: Once | INTRAVENOUS | Status: AC | PRN
  Administered 2017-08-20: 20.4 via INTRAVENOUS

## 2017-08-21 ENCOUNTER — Encounter (HOSPITAL_COMMUNITY): Payer: Self-pay

## 2017-08-21 ENCOUNTER — Other Ambulatory Visit: Payer: Self-pay

## 2017-08-21 ENCOUNTER — Encounter (HOSPITAL_COMMUNITY)
Admission: RE | Admit: 2017-08-21 | Discharge: 2017-08-21 | Disposition: A | Discharge status: Home / Self Care | Payer: Medicare Other | Source: Ambulatory Visit | Attending: Urology | Admitting: Urology

## 2017-08-21 ENCOUNTER — Encounter: Payer: Self-pay | Admitting: Medical Oncology

## 2017-08-21 DIAGNOSIS — Z01812 Encounter for preprocedural laboratory examination: Secondary | ICD-10-CM | POA: Diagnosis not present

## 2017-08-21 DIAGNOSIS — N201 Calculus of ureter: Secondary | ICD-10-CM | POA: Insufficient documentation

## 2017-08-21 HISTORY — DX: Depression, unspecified: F32.A

## 2017-08-21 HISTORY — DX: Major depressive disorder, single episode, unspecified: F32.9

## 2017-08-21 HISTORY — DX: Insomnia, unspecified: G47.00

## 2017-08-21 HISTORY — DX: Basal cell carcinoma of skin, unspecified: C44.91

## 2017-08-21 HISTORY — DX: Atherosclerosis of aorta: I70.0

## 2017-08-21 LAB — BASIC METABOLIC PANEL
Anion gap: 10 (ref 5–15)
BUN: 20 mg/dL (ref 6–20)
CHLORIDE: 110 mmol/L (ref 101–111)
CO2: 22 mmol/L (ref 22–32)
Calcium: 9.4 mg/dL (ref 8.9–10.3)
Creatinine, Ser: 1.58 mg/dL — ABNORMAL HIGH (ref 0.61–1.24)
GFR calc Af Amer: 48 mL/min — ABNORMAL LOW (ref >60)
GFR calc non Af Amer: 41 mL/min — ABNORMAL LOW (ref >60)
GLUCOSE: 93 mg/dL (ref 65–99)
POTASSIUM: 4 mmol/L (ref 3.5–5.1)
Sodium: 142 mmol/L (ref 135–145)

## 2017-08-21 LAB — CBC
HCT: 35.2 % — ABNORMAL LOW (ref 39.0–52.0)
HEMOGLOBIN: 11.7 g/dL — AB (ref 13.0–17.0)
MCH: 31.5 pg (ref 26.0–34.0)
MCHC: 33.2 g/dL (ref 30.0–36.0)
MCV: 94.9 fL (ref 78.0–100.0)
Platelets: 262 10*3/uL (ref 150–400)
RBC: 3.71 MIL/uL — ABNORMAL LOW (ref 4.22–5.81)
RDW: 13.5 % (ref 11.5–15.5)
WBC: 4.8 10*3/uL (ref 4.0–10.5)

## 2017-08-21 NOTE — Progress Notes (Signed)
Faxed bone scan result from 08/20/17 to [place] [phone]

## 2017-08-21 NOTE — Progress Notes (Signed)
Bmp routed via epic to dr borden  

## 2017-08-24 NOTE — H&P (Signed)
Office Visit Report     08/06/2017   --------------------------------------------------------------------------------   Jared Jacobs  MRN: [id number]  PRIMARY CARE:  [name] (retired), MD  DOB: 1940-12-04, 77 year old Male  REFERRING:    SSN: [id number]  PROVIDER:  Kathie Rhodes, M.D.    TREATING:  Azucena Fallen    LOCATION:  [facility] [phone]   --------------------------------------------------------------------------------   CC: I have ureteral stone.  HPI: Jared Jacobs is a 77 year-old male established patient who is here for ureteral stone.  08/06/17: Patient with history of prostate cancer and renal stones. He is currently undergoing clinical trial at [facility]. He had routine surveillance imaging following clinical trial. This showed no evidence of current andenopathy or findings of metastatic disease. Per his report his PSA level has remained undetectable. However, he was noted to have cluster of right distal ureteral stones. He has approximatly 3 stones ranging in size from 5 mm, 46m, and 2 mm. There was some moderate right hydronephrosis noted. Multiple non obstructing renal calculi noted on the left. He had some flank pain back in December, but denies any recent flank pain. He denies exacerbation of voiding symptoms. No gross hematuria, dysuria, fevers, nausea, or vomiting. He denies seeing a stone pass. Creatinine noted to be 1.24.   The problem is on the right side.     ALLERGIES: No Allergies    MEDICATIONS: Amitriptyline Hcl 75 mg tablet Oral  Aspirin 81 MG TABS Oral  Cialis 5 mg tablet 0 Oral  Lipitor 40 mg tablet Oral  NexIUM 40 MG Oral Capsule Delayed Release Oral  Uribel 118 mg-10 mg-40.8 mg-36 mg-0.12 mg capsule 0 Oral 4 times daily     GU PSH: Laparoscopy; Lymphadenectomy - 2017 Robotic Radical Prostatectomy - 2017      PSH Notes: Laparoscopy With Bilateral Total Pelvic Lymphadenectomy, Prostatect Retropubic Radical W/  Nerve Sparing Laparoscopic, No Surgical Problems   NON-GU PSH: None   GU PMH: Flank Pain - 04/12/2017 Interstitial Cystitis (w/o hematuria), Chronic interstitial cystitis without hematuria - 08/20/2015 Stress Incontinence, Male stress incontinence - 08/20/2015 ED due to arterial insufficiency, Erectile dysfunction due to arterial insufficiency - 08/12/2015 Prostate Cancer, Adenocarcinoma of prostate - 08/12/2015 Renal calculus, Nephrolithiasis - 2014    NON-GU PMH: Personal history of other diseases of the digestive system, History of esophageal reflux - 2014 Personal history of other endocrine, nutritional and metabolic disease, History of hypercholesterolemia - 2014    FAMILY HISTORY: Brain Cancer - Brother Cancer - Father Family Health Status Number - Runs In Family Kidney Cancer - Mother   SOCIAL HISTORY: Marital Status: Single Preferred Language: English; Ethnicity: Not Hispanic Or Latino; Race: White     Notes: Never A Smoker, Death In The Family Mother, Marital History - Single, Tobacco Use, Occupation:, Death In The Family Father, Alcohol Use   REVIEW OF SYSTEMS:    GU Review Male:   Patient denies frequent urination, hard to postpone urination, burning/ pain with urination, get up at night to urinate, leakage of urine, stream starts and stops, trouble starting your stream, have to strain to urinate , erection problems, and penile pain.  Gastrointestinal (Upper):   Patient denies nausea, vomiting, and indigestion/ heartburn.  Gastrointestinal (Lower):   Patient denies diarrhea and constipation.  Constitutional:   Patient denies fever, night sweats, weight loss, and fatigue.  Skin:   Patient denies skin rash/ lesion and itching.  Eyes:   Patient denies blurred vision and double  vision.  Ears/ Nose/ Throat:   Patient denies sore throat and sinus problems.  Hematologic/Lymphatic:   Patient denies swollen glands and easy bruising.  Cardiovascular:   Patient denies leg swelling and  chest pains.  Respiratory:   Patient denies cough and shortness of breath.  Endocrine:   Patient denies excessive thirst.  Musculoskeletal:   Patient denies back pain and joint pain.  Neurological:   Patient denies headaches and dizziness.  Psychologic:   Patient denies depression and anxiety.   VITAL SIGNS:      08/06/2017 12:54 PM  Weight 178 lb / 80.74 kg  Height 70 in / 177.8 cm  BP 148/92 mmHg  Pulse 80 /min  Temperature 97.7 F / 36.5 C  BMI 25.5 kg/m   MULTI-SYSTEM PHYSICAL EXAMINATION:    Constitutional: Well-nourished. No physical deformities. Normally developed. Good grooming.  Respiratory: No labored breathing, no use of accessory muscles. Normal breath sounds.  Cardiovascular: Regular rate and rhythm. No murmur, no gallop. Normal temperature, normal extremity pulses, no swelling, no varicosities.  Skin: No paleness, no jaundice, no cyanosis. No lesion, no ulcer, no rash.  Neurologic / Psychiatric: Oriented to time, oriented to place, oriented to person. No depression, no anxiety, no agitation.  Gastrointestinal: No mass, no tenderness, no rigidity, non obese abdomen.  Musculoskeletal: Spine, ribs, pelvis no bilateral tenderness. Normal gait and station of head and neck.     PAST DATA REVIEWED:  Source Of History:  Patient  Lab Test Review:   CMP  Records Review:   Previous Patient Records  Urine Test Review:   Urinalysis  X-Ray Review: C.T. Abdomen/Pelvis: Reviewed Films. Reviewed Report.     10/20/15 08/21/15 03/20/15 01/06/15 09/30/14 03/24/14 10/04/13 04/21/13  PSA  Total PSA < 0.01  0.02  9.81  10.13  9.95  7.38  7.55  5.18   Free PSA   1.72   1.47  1.32  1.17  0.67   % Free PSA   _0 PROCEDURES:         KUB - [id number]  A single view of the abdomen is obtained. Renal shadows are not well visualized today due to overlying bowel gas pattern. No obvious opacities noted along expected anatomical tract of the left ureter. There continues to be  opacities along expected anatomical course of the right ureter in the vicinity of the right UVJ. This appears consistent with cluster of calculi noted on C.T imaging. Prominent overlying bowel gas pattern. Stable pelvic calcifications.                Urinalysis Dipstick Dipstick Cont'd  Color: Yellow Bilirubin: Neg  Appearance: Clear Ketones: Neg  Specific Gravity: 1.020 Blood: Neg  pH: 6.0 Protein: Trace  Glucose: Neg Urobilinogen: 0.2    Nitrites: Neg    Leukocyte Esterase: Neg    ASSESSMENT:      ICD-10 Details  1 GU:   Renal and ureteral calculus - N20.2    PLAN:            Medications New Meds: Tamsulosin Hcl 0.4 mg capsule 1 capsule PO Daily   #14  0 Refill(s)  Oxycodone-Acetaminophen 5 mg-325 mg tablet 1 tablet PO Q 6 H PRN   #20  0 Refill(s)            Orders Labs Urine Culture          Document Letter(s):  Created for Patient: Clinical  Summary         Notes:   I will send urine for culture today. KUB today continues show opacities along expected anatomical course of the right ureter in the vicinity of the right UVJ. This appears consistent with cluster of calculi noted on C.T imaging. Treatment options reviewed in detail today. Patient would like to consider moving forward with intervention. We discussed that I will review images and discuss case with his urologist. In the meantime, I will have him begin MET. Rx for Tamsulosin and pain provided today. We discussed indications and side effects. I encouraged that he continue to hydrate well and strain his urine. Return precuations discussed for fever or progressive symptoms. He voiced understanding. I will be in touch regarding follow up recommendations.     * Signed by Azucena Fallen on 08/07/17 at 6:32 AM (EDT)*       APPENDED NOTES:  I spoke with Mr. Quebedeaux and reviewed his imaging studies with him. We discussed management of his 3 right distal ureteral calculi. Considering the fact that he has not been able  to spontaneously pass the stones over the past few weeks, I did recommend that we tentatively plan for surgical intervention. He is starting medical expulsion therapy this week. We reviewed options including ureteroscopy versus shockwave lithotripsy and the pros and cons of each approach. Considering the fact that he has multiple stones and the fact that he does wish to proceed with the most definitive treatment, we have agreed to proceed with cystoscopy, right retrograde pyelography, right ureteroscopy with laser lithotripsy and possible right ureteral stent placement. We reviewed the potential risks and complications as well as the expected recovery process associated with these procedures. He gives informed consent to proceed.   In addition, he remains disease free after completion of his clinical trial at [facility]. He remains on androgen deprivation although this will stop and he plans to follow-up at [facility] for his prostate cancer.     * Signed by [name], M.D. on 08/07/17 at 6:52 PM (EDT)*

## 2017-08-27 ENCOUNTER — Ambulatory Visit (HOSPITAL_COMMUNITY)
Admission: RE | Admit: 2017-08-27 | Discharge: 2017-08-27 | Disposition: A | Discharge status: Home / Self Care | Payer: Medicare Other | Source: Ambulatory Visit | Attending: Urology | Admitting: Urology

## 2017-08-27 ENCOUNTER — Encounter (HOSPITAL_COMMUNITY)
Admission: RE | Disposition: A | Discharge status: Home / Self Care | Payer: Self-pay | Source: Ambulatory Visit | Attending: Urology

## 2017-08-27 ENCOUNTER — Ambulatory Visit (HOSPITAL_COMMUNITY): Payer: Medicare Other | Admitting: Certified Registered Nurse Anesthetist

## 2017-08-27 ENCOUNTER — Encounter (HOSPITAL_COMMUNITY): Payer: Self-pay

## 2017-08-27 ENCOUNTER — Ambulatory Visit (HOSPITAL_COMMUNITY): Payer: Medicare Other

## 2017-08-27 ENCOUNTER — Telehealth (HOSPITAL_COMMUNITY): Payer: Self-pay | Admitting: *Deleted

## 2017-08-27 DIAGNOSIS — K219 Gastro-esophageal reflux disease without esophagitis: Secondary | ICD-10-CM | POA: Diagnosis not present

## 2017-08-27 DIAGNOSIS — N201 Calculus of ureter: Secondary | ICD-10-CM | POA: Insufficient documentation

## 2017-08-27 DIAGNOSIS — C61 Malignant neoplasm of prostate: Secondary | ICD-10-CM | POA: Diagnosis not present

## 2017-08-27 DIAGNOSIS — F329 Major depressive disorder, single episode, unspecified: Secondary | ICD-10-CM | POA: Diagnosis not present

## 2017-08-27 DIAGNOSIS — Z79899 Other long term (current) drug therapy: Secondary | ICD-10-CM | POA: Insufficient documentation

## 2017-08-27 DIAGNOSIS — I739 Peripheral vascular disease, unspecified: Secondary | ICD-10-CM | POA: Diagnosis not present

## 2017-08-27 DIAGNOSIS — E785 Hyperlipidemia, unspecified: Secondary | ICD-10-CM | POA: Insufficient documentation

## 2017-08-27 DIAGNOSIS — Z8546 Personal history of malignant neoplasm of prostate: Secondary | ICD-10-CM | POA: Diagnosis not present

## 2017-08-27 HISTORY — PX: CYSTOSCOPY/URETEROSCOPY/HOLMIUM LASER/STENT PLACEMENT: SHX6546

## 2017-08-27 SURGERY — CYSTOSCOPY/URETEROSCOPY/HOLMIUM LASER/STENT PLACEMENT
Anesthesia: General | Laterality: Right

## 2017-08-27 MED ORDER — PROMETHAZINE HCL 25 MG/ML IJ SOLN: 6.2500 mg | INTRAMUSCULAR | Status: DC | PRN

## 2017-08-27 MED ORDER — LACTATED RINGERS IV SOLN
INTRAVENOUS | Status: DC
  Administered 2017-08-27: 14:00:00 via INTRAVENOUS
  Administered 2017-08-27: 1000 mL via INTRAVENOUS

## 2017-08-27 MED ORDER — HYDROMORPHONE HCL 1 MG/ML IJ SOLN: 0.2500 mg | INTRAMUSCULAR | Status: DC | PRN

## 2017-08-27 MED ORDER — DEXAMETHASONE SODIUM PHOSPHATE 10 MG/ML IJ SOLN
INTRAMUSCULAR | Status: DC | PRN
  Administered 2017-08-27: 10 mg via INTRAVENOUS

## 2017-08-27 MED ORDER — CEFAZOLIN SODIUM-DEXTROSE 2-4 GM/100ML-% IV SOLN
2.0000 g | Freq: Once | INTRAVENOUS | Status: AC
  Administered 2017-08-27: 2 g via INTRAVENOUS
  Filled 2017-08-27: qty 100

## 2017-08-27 MED ORDER — LIDOCAINE 2% (20 MG/ML) 5 ML SYRINGE
INTRAMUSCULAR | Status: DC | PRN
  Administered 2017-08-27: 100 mg via INTRAVENOUS

## 2017-08-27 MED ORDER — ONDANSETRON HCL 4 MG/2ML IJ SOLN
INTRAMUSCULAR | Status: AC
  Filled 2017-08-27: qty 2

## 2017-08-27 MED ORDER — FENTANYL CITRATE (PF) 100 MCG/2ML IJ SOLN
INTRAMUSCULAR | Status: DC | PRN
  Administered 2017-08-27: 25 ug via INTRAVENOUS
  Administered 2017-08-27: 50 ug via INTRAVENOUS
  Administered 2017-08-27: 25 ug via INTRAVENOUS

## 2017-08-27 MED ORDER — PROPOFOL 10 MG/ML IV BOLUS
INTRAVENOUS | Status: DC | PRN
  Administered 2017-08-27: 120 mg via INTRAVENOUS

## 2017-08-27 MED ORDER — DEXAMETHASONE SODIUM PHOSPHATE 10 MG/ML IJ SOLN
INTRAMUSCULAR | Status: AC
  Filled 2017-08-27: qty 1

## 2017-08-27 MED ORDER — LIDOCAINE 2% (20 MG/ML) 5 ML SYRINGE
INTRAMUSCULAR | Status: AC
  Filled 2017-08-27: qty 10

## 2017-08-27 MED ORDER — PHENAZOPYRIDINE HCL 200 MG PO TABS: 200.0000 mg | ORAL_TABLET | Freq: Three times a day (TID) | ORAL | 0 refills | Status: AC | PRN

## 2017-08-27 MED ORDER — HYDROCODONE-ACETAMINOPHEN 7.5-325 MG PO TABS: 1.0000 | ORAL_TABLET | Freq: Once | ORAL | Status: DC | PRN

## 2017-08-27 MED ORDER — IOHEXOL 300 MG/ML  SOLN
INTRAMUSCULAR | Status: DC | PRN
  Administered 2017-08-27: 2 mL

## 2017-08-27 MED ORDER — FENTANYL CITRATE (PF) 100 MCG/2ML IJ SOLN
INTRAMUSCULAR | Status: AC
  Filled 2017-08-27: qty 2

## 2017-08-27 MED ORDER — SODIUM CHLORIDE 0.9 % IR SOLN
Status: DC | PRN
  Administered 2017-08-27: 3000 mL

## 2017-08-27 MED ORDER — ONDANSETRON HCL 4 MG/2ML IJ SOLN
INTRAMUSCULAR | Status: DC | PRN
  Administered 2017-08-27: 4 mg via INTRAVENOUS

## 2017-08-27 MED ORDER — PROPOFOL 10 MG/ML IV BOLUS
INTRAVENOUS | Status: AC
  Filled 2017-08-27: qty 20

## 2017-08-27 MED ORDER — SODIUM CHLORIDE 0.9 % IR SOLN
Status: DC | PRN
  Administered 2017-08-27: 1000 mL

## 2017-08-27 MED ORDER — MEPERIDINE HCL 50 MG/ML IJ SOLN: 6.2500 mg | INTRAMUSCULAR | Status: DC | PRN

## 2017-08-27 SURGICAL SUPPLY — 20 items
BAG URO CATCHER STRL LF (MISCELLANEOUS) ×2 IMPLANT
BASKET ZERO TIP NITINOL 2.4FR (BASKET) ×2 IMPLANT
CATH INTERMIT  6FR 70CM (CATHETERS) ×2 IMPLANT
CLOTH BEACON ORANGE TIMEOUT ST (SAFETY) ×2 IMPLANT
COVER FOOTSWITCH UNIV (MISCELLANEOUS) IMPLANT
COVER SURGICAL LIGHT HANDLE (MISCELLANEOUS) ×2 IMPLANT
FIBER LASER FLEXIVA 365 (UROLOGICAL SUPPLIES) IMPLANT
FIBER LASER TRAC TIP (UROLOGICAL SUPPLIES) IMPLANT
GLOVE BIOGEL M STRL SZ7.5 (GLOVE) ×2 IMPLANT
GOWN STRL REUS W/TWL LRG LVL3 (GOWN DISPOSABLE) ×4 IMPLANT
GUIDEWIRE ANG ZIPWIRE 038X150 (WIRE) IMPLANT
GUIDEWIRE STR DUAL SENSOR (WIRE) ×2 IMPLANT
IV NS 1000ML (IV SOLUTION)
IV NS 1000ML BAXH (IV SOLUTION) IMPLANT
MANIFOLD NEPTUNE II (INSTRUMENTS) ×2 IMPLANT
PACK CYSTO (CUSTOM PROCEDURE TRAY) ×2 IMPLANT
SHEATH URETERAL 12FRX35CM (MISCELLANEOUS) IMPLANT
STENT URET 6FRX24 CONTOUR (STENTS) ×2 IMPLANT
TUBING CONNECTING 10 (TUBING) ×2 IMPLANT
TUBING UROLOGY SET (TUBING) IMPLANT

## 2017-08-27 NOTE — Anesthesia Preprocedure Evaluation (Addendum)
Anesthesia Evaluation  Patient identified by MRN, date of birth, ID band Patient awake    Reviewed: Allergy & Precautions, NPO status , Patient's Chart, lab work & pertinent test results  Airway Mallampati: II  TM Distance: >3 FB Neck ROM: Full    Dental no notable dental hx. (+) Teeth Intact   Pulmonary neg pulmonary ROS,    Pulmonary exam normal breath sounds clear to auscultation       Cardiovascular + Peripheral Vascular Disease  Normal cardiovascular exam Rhythm:Regular Rate:Normal     Neuro/Psych PSYCHIATRIC DISORDERS Depression negative neurological ROS     GI/Hepatic Neg liver ROS, GERD  Medicated and Controlled,  Endo/Other  Hyperlipidemia  Renal/GU negative Renal ROSRight renal calculus   Hx/o Prostate Ca- S/P Prostatectomy Hx/o interstitial cystitis    Musculoskeletal negative musculoskeletal ROS (+)   Abdominal   Peds  Hematology negative hematology ROS (+)   Anesthesia Other Findings   Reproductive/Obstetrics                            Anesthesia Physical Anesthesia Plan  ASA: II  Anesthesia Plan: General   Post-op Pain Management:    Induction: Intravenous  PONV Risk Score and Plan: 4 or greater and Ondansetron, Dexamethasone and Treatment may vary due to age or medical condition  Airway Management Planned: LMA  Additional Equipment:   Intra-op Plan:   Post-operative Plan: Extubation in OR  Informed Consent: I have reviewed the patients History and Physical, chart, labs and discussed the procedure including the risks, benefits and alternatives for the proposed anesthesia with the patient or authorized representative who has indicated his/her understanding and acceptance.   Dental advisory given  Plan Discussed with: CRNA and Surgeon  Anesthesia Plan Comments:         Anesthesia Quick Evaluation

## 2017-08-27 NOTE — Discharge Instructions (Signed)
1. You may see some blood in the urine and may have some burning with urination for 48-72 hours. You also may notice that you have to urinate more frequently or urgently after your procedure which is normal.  2. You should call should you develop an inability urinate, fever > 101, persistent nausea and vomiting that prevents you from eating or drinking to stay hydrated.  3. If you have a stent, you will likely urinate more frequently and urgently until the stent is removed and you may experience some discomfort/pain in the lower abdomen and flank especially when urinating. You may take pain medication prescribed to you if needed for pain. You may also intermittently have blood in the urine until the stent is removed. 4. You may remove your stent on Friday morning 08/31/17.  Simply pull the string that is taped to your body and the stent will easily come out.  This may be best done in the shower as some urine may come out with the stent.  Usually you will feel relief once the stent is removed, but occasionally patients can develop pain due to residual swelling of the ureter that may temporarily obstruct the kidney.  This can be managed by taking pain medication and it will typically resolve with time.  Please do not hesitate to call if you have pain that is not controlled with your pain medication or does not improved within 24-48 hours.

## 2017-08-27 NOTE — Interval H&P Note (Signed)
History and Physical Interval Note:  08/27/2017 12:20 PM  Jared Jacobs  has presented today for surgery, with the diagnosis of RIGHT RENAL CALCULI  The various methods of treatment have been discussed with the patient and family. After consideration of risks, benefits and other options for treatment, the patient has consented to  Procedure(s) with comments: CYSTOSCOPY/RETROGRADE/URETEROSCOPY/HOLMIUM LASER/STENT PLACEMENT (Right) - ONLY NEED 45 MIN TOTAL FOR PROCEDURE as a surgical intervention .  The patient's history has been reviewed, patient examined, no change in status, stable for surgery.  I have reviewed the patient's chart and labs.  Questions were answered to the patient's satisfaction.     Sameka Bagent,LES

## 2017-08-27 NOTE — Anesthesia Procedure Notes (Signed)
Procedure Name: LMA Insertion Date/Time: 08/27/2017 1:13 PM Performed by: Mitzie Na, CRNA Pre-anesthesia Checklist: Patient identified, Suction available, Patient being monitored, Timeout performed and Emergency Drugs available Patient Re-evaluated:Patient Re-evaluated prior to induction Oxygen Delivery Method: Circle system utilized Preoxygenation: Pre-oxygenation with 100% oxygen Induction Type: IV induction LMA: LMA inserted LMA Size: 4.0 Number of attempts: 1

## 2017-08-27 NOTE — Anesthesia Postprocedure Evaluation (Signed)
Anesthesia Post Note  Patient: Jared Jacobs  Procedure(s) Performed: CYSTOSCOPY/RETROGRADE/URETEROSCOPY/STENT PLACEMENT (Right )     Patient location during evaluation: PACU Anesthesia Type: General Level of consciousness: awake and alert and oriented Pain management: pain level controlled Vital Signs Assessment: post-procedure vital signs reviewed and stable Respiratory status: spontaneous breathing, nonlabored ventilation and respiratory function stable Cardiovascular status: blood pressure returned to baseline and stable Postop Assessment: no apparent nausea or vomiting Anesthetic complications: no    Last Vitals:  Vitals:   08/27/17 1430 08/27/17 1456  BP: (!) 175/96 (!) 172/84  Pulse: 62 66  Resp: 18 18  Temp: 36.7 C   SpO2: 100% 98%    Last Pain:  Vitals:   08/27/17 1456  TempSrc:   PainSc: 0-No pain                 Tanaysha Alkins A.

## 2017-08-27 NOTE — Transfer of Care (Signed)
Immediate Anesthesia Transfer of Care Note  Patient: Jared Jacobs  Procedure(s) Performed: CYSTOSCOPY/RETROGRADE/URETEROSCOPY/STENT PLACEMENT (Right )  Patient Location: PACU  Anesthesia Type:General  Level of Consciousness: awake, alert , oriented and patient cooperative  Airway & Oxygen Therapy: Patient Spontanous Breathing and Patient connected to face mask oxygen  Post-op Assessment: Report given to RN, Post -op Vital signs reviewed and stable and Patient moving all extremities  Post vital signs: Reviewed and stable  Last Vitals:  Vitals Value Taken Time  BP 150/127 08/27/2017  1:56 PM  Temp    Pulse 63 08/27/2017  1:57 PM  Resp 12 08/27/2017  1:57 PM  SpO2 100 % 08/27/2017  1:57 PM  Vitals shown include unvalidated device data.  Last Pain:  Vitals:   08/27/17 0958  TempSrc:   PainSc: 0-No pain      Patients Stated Pain Goal: 4 (23/34/35 6861)  Complications: No apparent anesthesia complications

## 2017-08-27 NOTE — Op Note (Signed)
Preoperative diagnosis: Right ureteral calculi  Postoperative diagnosis: Right ureteral calculi  Procedure:  1. Cystoscopy 2. Right ureteroscopy and stone removal 3. Right ureteral stent placement (6 x 24 with string) 4. Right retrograde pyelography with interpretation  Surgeon: Pryor Curia. M.D.  Anesthesia: General  Complications: None  Intraoperative findings: Right retrograde pyelography demonstrated a filling defect within the distal right ureter consistent with the patient's known calculi without other abnormalities.  EBL: Minimal  Specimens: 1. Right ureteral calculi  Disposition of specimens: [facility] for stone analysis  Indication: Jared Jacobs is a 77 y.o. year old patient with urolithiasis. Jared Jacobs was incidentally noted to have multiple right distal ureteral calculi on a CT scan for staging of prostate cancer.  Jared Jacobs has been asymptomatic but has been unable to pass his stones after a trial of medical expulsion.  After reviewing the management options for treatment, the patient elected to proceed with the above surgical procedure(s). We have discussed the potential benefits and risks of the procedure, side effects of the proposed treatment, the likelihood of the patient achieving the goals of the procedure, and any potential problems that might occur during the procedure or recuperation. Informed consent has been obtained.  Description of procedure:  The patient was taken to the operating room and general anesthesia was induced.  The patient was placed in the dorsal lithotomy position, prepped and draped in the usual sterile fashion, and preoperative antibiotics were administered. A preoperative time-out was performed.   Cystourethroscopy was performed.  The patient's urethra was examined and was normal.  The prostatic urethra was absent consistent with his surgical history. The bladder was then systematically examined in its entirety. There was no  evidence for any bladder tumors, stones, or other mucosal pathology.    Attention then turned to the right ureteral orifice and a ureteral catheter was used to intubate the ureteral orifice.  Omnipaque contrast was injected through the ureteral catheter and a retrograde pyelogram was performed with findings as dictated above.  A 0.38 sensor guidewire was then advanced up the right ureter into the renal pelvis under fluoroscopic guidance. The 6 Fr semirigid ureteroscope was then advanced into the ureter next to the guidewire and the calculus was identified.  All stones were then removed from the ureter with a zero tip nitinol basket.  Reinspection of the ureter revealed no remaining visible stones or fragments.   The wire was then backloaded through the cystoscope and a ureteral stent was advance over the wire using Seldinger technique.  The stent was positioned appropriately under fluoroscopic and cystoscopic guidance.  The wire was then removed with an adequate stent curl noted in the renal pelvis as well as in the bladder.  The bladder was then emptied and the procedure ended.  The patient appeared to tolerate the procedure well and without complications.  The patient was able to be awakened and transferred to the recovery unit in satisfactory condition.   Jared Jacobs will remove his stent on Friday morning.

## 2017-08-28 ENCOUNTER — Telehealth: Payer: Self-pay | Admitting: Medical Oncology

## 2017-08-28 DIAGNOSIS — N202 Calculus of kidney with calculus of ureter: Secondary | ICD-10-CM | POA: Diagnosis not present

## 2017-08-28 NOTE — Telephone Encounter (Signed)
Jared Jacobs had surgery yesterday to remove kidney stones and have stent placed. He states the surgery went well and he has not complaints. He is very appreciative of all the care he received and of my call.

## 2017-09-25 DIAGNOSIS — N202 Calculus of kidney with calculus of ureter: Secondary | ICD-10-CM | POA: Diagnosis not present

## 2017-10-29 DIAGNOSIS — Z006 Encounter for examination for normal comparison and control in clinical research program: Secondary | ICD-10-CM | POA: Diagnosis not present

## 2017-10-29 DIAGNOSIS — C61 Malignant neoplasm of prostate: Secondary | ICD-10-CM | POA: Diagnosis not present

## 2017-11-23 DIAGNOSIS — Z6827 Body mass index (BMI) 27.0-27.9, adult: Secondary | ICD-10-CM | POA: Diagnosis not present

## 2017-11-23 DIAGNOSIS — J40 Bronchitis, not specified as acute or chronic: Secondary | ICD-10-CM | POA: Diagnosis not present

## 2017-11-23 DIAGNOSIS — R05 Cough: Secondary | ICD-10-CM | POA: Diagnosis not present

## 2017-11-23 DIAGNOSIS — K219 Gastro-esophageal reflux disease without esophagitis: Secondary | ICD-10-CM | POA: Diagnosis not present

## 2017-12-22 DIAGNOSIS — Z23 Encounter for immunization: Secondary | ICD-10-CM | POA: Diagnosis not present

## 2018-01-21 ENCOUNTER — Other Ambulatory Visit: Payer: Self-pay | Admitting: Medical Oncology

## 2018-01-21 ENCOUNTER — Telehealth: Payer: Self-pay | Admitting: Oncology

## 2018-01-21 DIAGNOSIS — C61 Malignant neoplasm of prostate: Secondary | ICD-10-CM

## 2018-01-21 NOTE — Telephone Encounter (Signed)
Tried to reach regarding 10/28 I did leave a message

## 2018-01-24 ENCOUNTER — Other Ambulatory Visit: Payer: Self-pay | Admitting: Medical Oncology

## 2018-01-24 DIAGNOSIS — C61 Malignant neoplasm of prostate: Secondary | ICD-10-CM

## 2018-02-11 ENCOUNTER — Encounter (HOSPITAL_COMMUNITY)
Admission: RE | Admit: 2018-02-11 | Discharge: 2018-02-11 | Disposition: A | Discharge status: Home / Self Care | Payer: Medicare Other | Source: Ambulatory Visit | Attending: Radiation Oncology | Admitting: Radiation Oncology

## 2018-02-11 ENCOUNTER — Encounter (HOSPITAL_COMMUNITY): Payer: Self-pay

## 2018-02-11 ENCOUNTER — Ambulatory Visit (HOSPITAL_COMMUNITY)
Admission: RE | Admit: 2018-02-11 | Discharge: 2018-02-11 | Disposition: A | Discharge status: Home / Self Care | Payer: Medicare Other | Source: Ambulatory Visit | Attending: Radiation Oncology | Admitting: Radiation Oncology

## 2018-02-11 ENCOUNTER — Inpatient Hospital Stay: Payer: Medicare Other | Attending: Radiation Oncology

## 2018-02-11 DIAGNOSIS — N200 Calculus of kidney: Secondary | ICD-10-CM | POA: Insufficient documentation

## 2018-02-11 DIAGNOSIS — Z08 Encounter for follow-up examination after completed treatment for malignant neoplasm: Secondary | ICD-10-CM | POA: Diagnosis not present

## 2018-02-11 DIAGNOSIS — C61 Malignant neoplasm of prostate: Secondary | ICD-10-CM | POA: Diagnosis not present

## 2018-02-11 DIAGNOSIS — I700 Atherosclerosis of aorta: Secondary | ICD-10-CM | POA: Insufficient documentation

## 2018-02-11 DIAGNOSIS — Z8546 Personal history of malignant neoplasm of prostate: Secondary | ICD-10-CM | POA: Diagnosis not present

## 2018-02-11 DIAGNOSIS — K5730 Diverticulosis of large intestine without perforation or abscess without bleeding: Secondary | ICD-10-CM | POA: Insufficient documentation

## 2018-02-11 DIAGNOSIS — R918 Other nonspecific abnormal finding of lung field: Secondary | ICD-10-CM | POA: Diagnosis not present

## 2018-02-11 LAB — CMP (CANCER CENTER ONLY)
ALBUMIN: 3.7 g/dL (ref 3.5–5.0)
ALT: 14 U/L (ref 0–44)
AST: 15 U/L (ref 15–41)
Alkaline Phosphatase: 107 U/L (ref 38–126)
Anion gap: 9 (ref 5–15)
BUN: 17 mg/dL (ref 8–23)
CHLORIDE: 106 mmol/L (ref 98–111)
CO2: 25 mmol/L (ref 22–32)
CREATININE: 1.22 mg/dL (ref 0.61–1.24)
Calcium: 9.6 mg/dL (ref 8.9–10.3)
GFR, EST NON AFRICAN AMERICAN: 57 mL/min — AB (ref >60)
GFR, Est AFR Am: >60 mL/min (ref >60)
GLUCOSE: 101 mg/dL — AB (ref 70–99)
Potassium: 4.3 mmol/L (ref 3.5–5.1)
SODIUM: 140 mmol/L (ref 135–145)
Total Bilirubin: 0.4 mg/dL (ref 0.3–1.2)
Total Protein: 7 g/dL (ref 6.5–8.1)

## 2018-02-11 LAB — CBC WITH DIFFERENTIAL (CANCER CENTER ONLY)
ABS IMMATURE GRANULOCYTES: 0.02 10*3/uL (ref 0.00–0.07)
Basophils Absolute: 0 10*3/uL (ref 0.0–0.1)
Basophils Relative: 1 %
EOS PCT: 3 %
Eosinophils Absolute: 0.1 10*3/uL (ref 0.0–0.5)
HEMATOCRIT: 37.8 % — AB (ref 39.0–52.0)
HEMOGLOBIN: 12.3 g/dL — AB (ref 13.0–17.0)
Immature Granulocytes: 1 %
LYMPHS ABS: 1 10*3/uL (ref 0.7–4.0)
LYMPHS PCT: 29 %
MCH: 31.1 pg (ref 26.0–34.0)
MCHC: 32.5 g/dL (ref 30.0–36.0)
MCV: 95.5 fL (ref 80.0–100.0)
MONO ABS: 0.5 10*3/uL (ref 0.1–1.0)
MONOS PCT: 13 %
Neutro Abs: 1.9 10*3/uL (ref 1.7–7.7)
Neutrophils Relative %: 53 %
Platelet Count: 205 10*3/uL (ref 150–400)
RBC: 3.96 MIL/uL — ABNORMAL LOW (ref 4.22–5.81)
RDW: 12.9 % (ref 11.5–15.5)
WBC Count: 3.5 10*3/uL — ABNORMAL LOW (ref 4.0–10.5)
nRBC: 0 % (ref 0.0–0.2)

## 2018-02-11 MED ORDER — IOHEXOL 300 MG/ML  SOLN
100.0000 mL | Freq: Once | INTRAMUSCULAR | Status: AC | PRN
  Administered 2018-02-11: 100 mL via INTRAVENOUS

## 2018-02-11 MED ORDER — SODIUM CHLORIDE 0.9 % IJ SOLN
INTRAMUSCULAR | Status: AC
  Filled 2018-02-11: qty 50

## 2018-02-11 MED ORDER — IOHEXOL 300 MG/ML  SOLN
30.0000 mL | Freq: Once | INTRAMUSCULAR | Status: AC | PRN
Start: 2018-02-11 — End: 2018-02-11
  Administered 2018-02-11: 30 mL via ORAL

## 2018-02-11 MED ORDER — TECHNETIUM TC 99M MEDRONATE IV KIT
21.1000 | PACK | Freq: Once | INTRAVENOUS | Status: AC | PRN
  Administered 2018-02-11: 21.1 via INTRAVENOUS

## 2018-02-12 ENCOUNTER — Encounter: Payer: Self-pay | Admitting: Medical Oncology

## 2018-02-12 LAB — TESTOSTERONE: Testosterone: <3 ng/dL — ABNORMAL LOW (ref 264–916)

## 2018-02-12 LAB — PROSTATE-SPECIFIC AG, SERUM (LABCORP): Prostate Specific Ag, Serum: <0.1 ng/mL (ref 0.0–4.0)

## 2018-02-12 NOTE — Progress Notes (Signed)
Faxed bone scan, CT chest, abdomen and pelvis with labs to Sinclair Grooms, [place]. Patient has received results via my chart and is pleased.

## 2018-05-27 DIAGNOSIS — Z006 Encounter for examination for normal comparison and control in clinical research program: Secondary | ICD-10-CM | POA: Diagnosis not present

## 2018-05-27 DIAGNOSIS — C61 Malignant neoplasm of prostate: Secondary | ICD-10-CM | POA: Diagnosis not present

## 2018-06-17 DIAGNOSIS — R1084 Generalized abdominal pain: Secondary | ICD-10-CM | POA: Diagnosis not present

## 2018-06-17 DIAGNOSIS — N202 Calculus of kidney with calculus of ureter: Secondary | ICD-10-CM | POA: Diagnosis not present

## 2018-06-19 DIAGNOSIS — K5900 Constipation, unspecified: Secondary | ICD-10-CM | POA: Diagnosis not present

## 2018-06-19 DIAGNOSIS — Z6827 Body mass index (BMI) 27.0-27.9, adult: Secondary | ICD-10-CM | POA: Diagnosis not present

## 2018-06-19 DIAGNOSIS — R1031 Right lower quadrant pain: Secondary | ICD-10-CM | POA: Diagnosis not present

## 2018-06-24 ENCOUNTER — Other Ambulatory Visit: Payer: Self-pay | Admitting: Medical Oncology

## 2018-06-24 ENCOUNTER — Telehealth: Payer: Self-pay | Admitting: Radiation Oncology

## 2018-06-24 ENCOUNTER — Encounter: Payer: Self-pay | Admitting: Medical Oncology

## 2018-06-24 DIAGNOSIS — K5900 Constipation, unspecified: Secondary | ICD-10-CM | POA: Diagnosis not present

## 2018-06-24 DIAGNOSIS — R1031 Right lower quadrant pain: Secondary | ICD-10-CM | POA: Diagnosis not present

## 2018-06-24 DIAGNOSIS — C61 Malignant neoplasm of prostate: Secondary | ICD-10-CM

## 2018-06-24 NOTE — Progress Notes (Signed)
Patient has completed his clinical trail at [facility] but still needs PSA and testosterone monitored every 90 days. Labs ordered and scheduling message sent. Patient is aware he will be notified of date and time.

## 2018-06-24 NOTE — Telephone Encounter (Signed)
Scheduled appt per 3/09 sch message. Pt aware.

## 2018-07-12 DIAGNOSIS — E038 Other specified hypothyroidism: Secondary | ICD-10-CM | POA: Diagnosis not present

## 2018-07-12 DIAGNOSIS — E7849 Other hyperlipidemia: Secondary | ICD-10-CM | POA: Diagnosis not present

## 2018-07-19 DIAGNOSIS — K219 Gastro-esophageal reflux disease without esophagitis: Secondary | ICD-10-CM | POA: Diagnosis not present

## 2018-07-19 DIAGNOSIS — R1031 Right lower quadrant pain: Secondary | ICD-10-CM | POA: Diagnosis not present

## 2018-07-19 DIAGNOSIS — Z0000 Encounter for general adult medical examination without abnormal findings: Secondary | ICD-10-CM | POA: Diagnosis not present

## 2018-07-19 DIAGNOSIS — C61 Malignant neoplasm of prostate: Secondary | ICD-10-CM | POA: Diagnosis not present

## 2018-07-19 DIAGNOSIS — I7090 Unspecified atherosclerosis: Secondary | ICD-10-CM | POA: Diagnosis not present

## 2018-07-19 DIAGNOSIS — E039 Hypothyroidism, unspecified: Secondary | ICD-10-CM | POA: Diagnosis not present

## 2018-07-19 DIAGNOSIS — Z1331 Encounter for screening for depression: Secondary | ICD-10-CM | POA: Diagnosis not present

## 2018-07-19 DIAGNOSIS — Z86010 Personal history of colonic polyps: Secondary | ICD-10-CM | POA: Diagnosis not present

## 2018-07-19 DIAGNOSIS — E785 Hyperlipidemia, unspecified: Secondary | ICD-10-CM | POA: Diagnosis not present

## 2018-08-26 ENCOUNTER — Other Ambulatory Visit: Payer: Self-pay

## 2018-08-26 ENCOUNTER — Inpatient Hospital Stay: Payer: Medicare Other | Attending: Radiation Oncology

## 2018-08-26 DIAGNOSIS — C61 Malignant neoplasm of prostate: Secondary | ICD-10-CM | POA: Insufficient documentation

## 2018-08-27 LAB — PSA, TOTAL AND FREE
PSA, Free Pct: UNDETERMINED %
PSA, Free: <0.02 ng/mL
Prostate Specific Ag, Serum: <0.1 ng/mL (ref 0.0–4.0)

## 2018-08-27 LAB — TESTOSTERONE: Testosterone: 223 ng/dL — ABNORMAL LOW (ref 264–916)

## 2018-09-25 IMAGING — CT CT ABD-PELV W/ CM
2 of 5 series · 13 of 46 positions shown, 15 images · IV contrast (APPLIED)
Comparison: Multiple exams, including 12/16/2015

CLINICAL DATA: Metastatic prostate cancer restaging. Prostatectomy
in 1082. Chemotherapy and radiation therapy with ongoing hormonal
therapy.

EXAM:
CT CHEST, ABDOMEN, AND PELVIS WITH CONTRAST
TECHNIQUE: Multidetector CT imaging of the chest, abdomen and pelvis was
performed following the standard protocol during bolus
administration of intravenous contrast.
CONTRAST:  100mL [id number] IOPAMIDOL ([id number]) INJECTION 61%

[Series 2: cap with · axial · 0.86mm/px · z∈[+1012,+1557]mm · 10 of 135 slices shown, 12 images]
[im 13/135  soft-tissue]
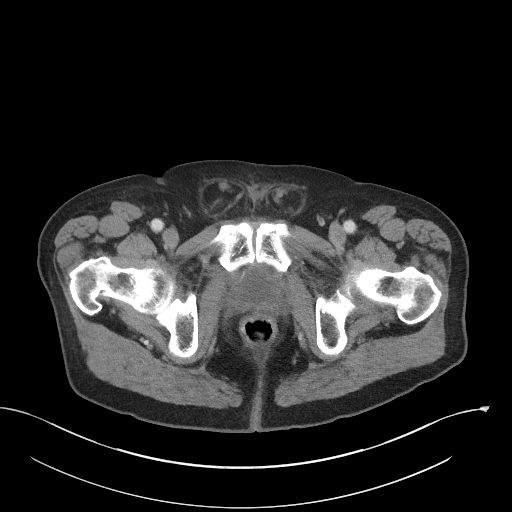
[im 13/135  bone]
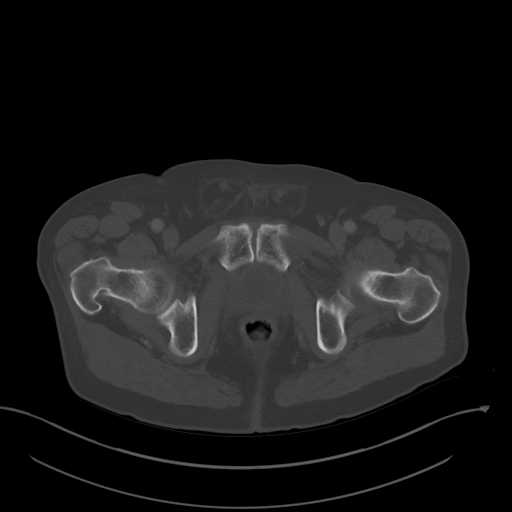
[im 25/135  soft-tissue]
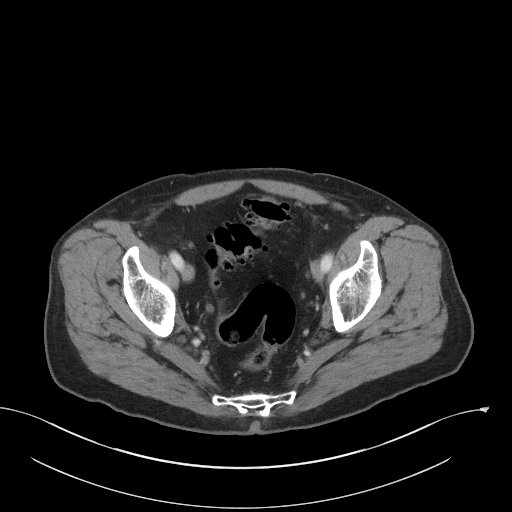
[im 37/135  soft-tissue]
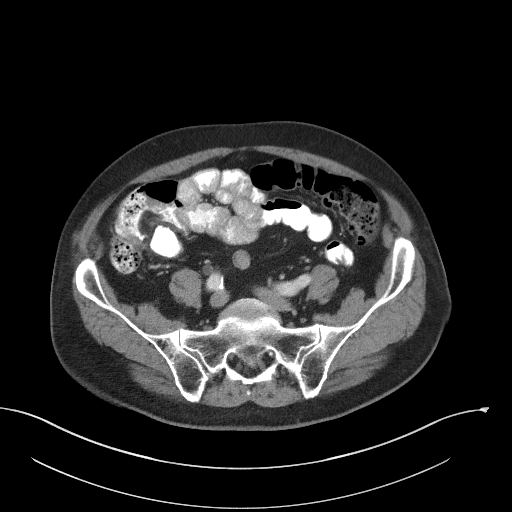
[im 49/135  soft-tissue]
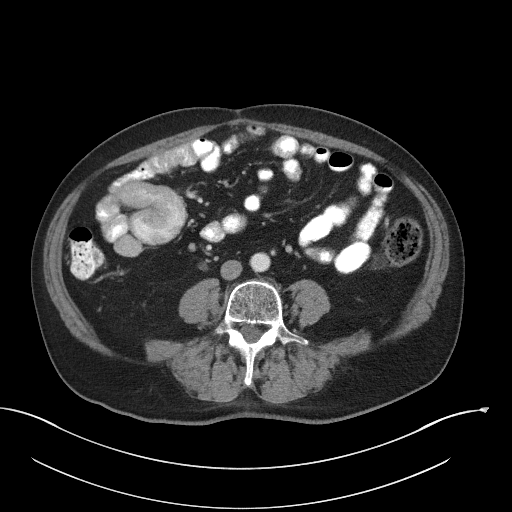
[im 61/135  soft-tissue]
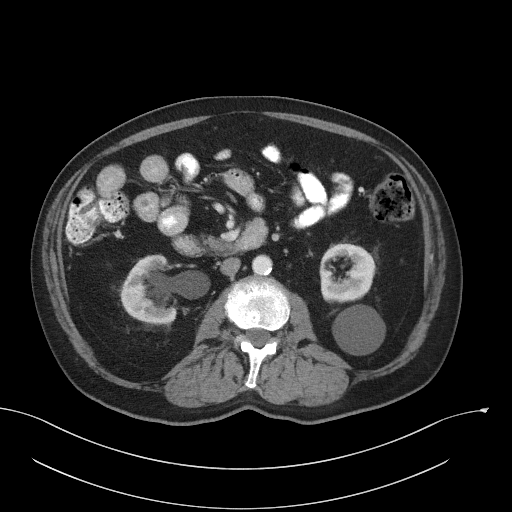
[im 74/135  soft-tissue]
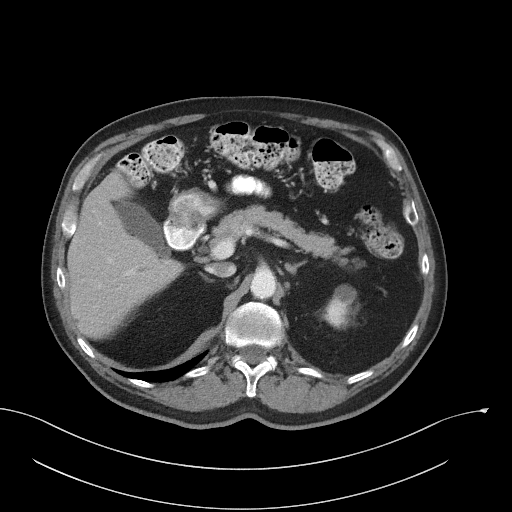
[im 86/135  soft-tissue]
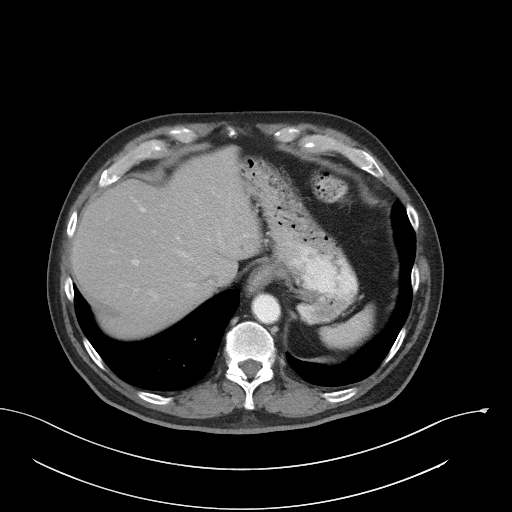
[im 98/135  soft-tissue]
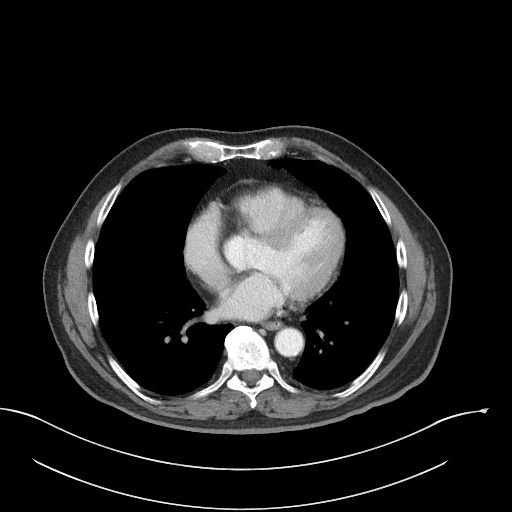
[im 110/135  soft-tissue]
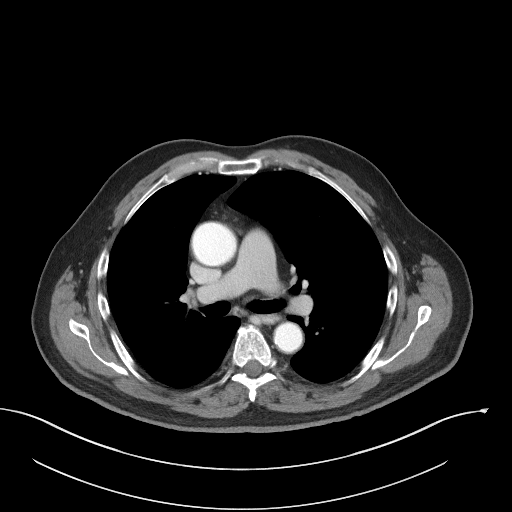
[im 110/135  bone]
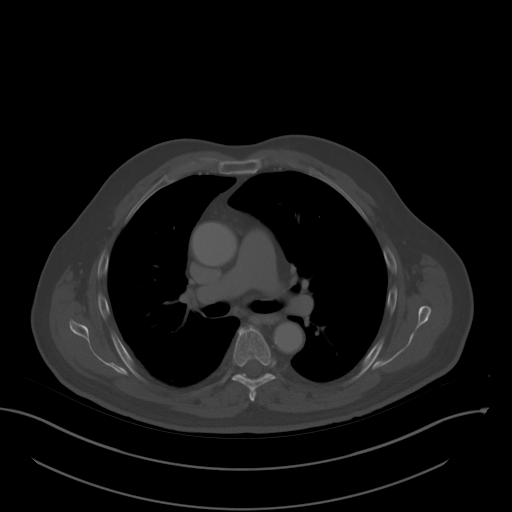
[im 122/135  soft-tissue]
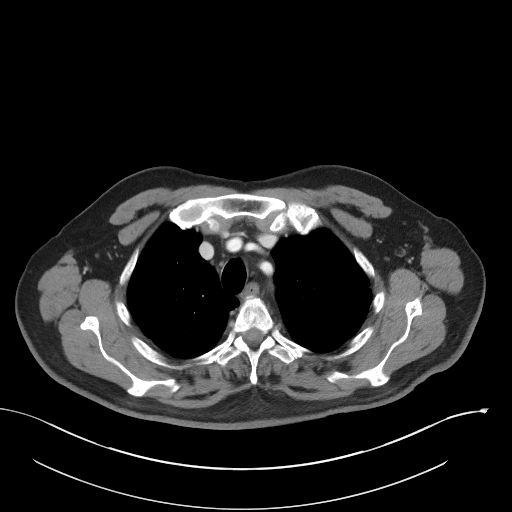

[Series 5: coronals · coronal · 0.91mm/px · 3 of 160 slices shown]
[im 54/160  soft-tissue]
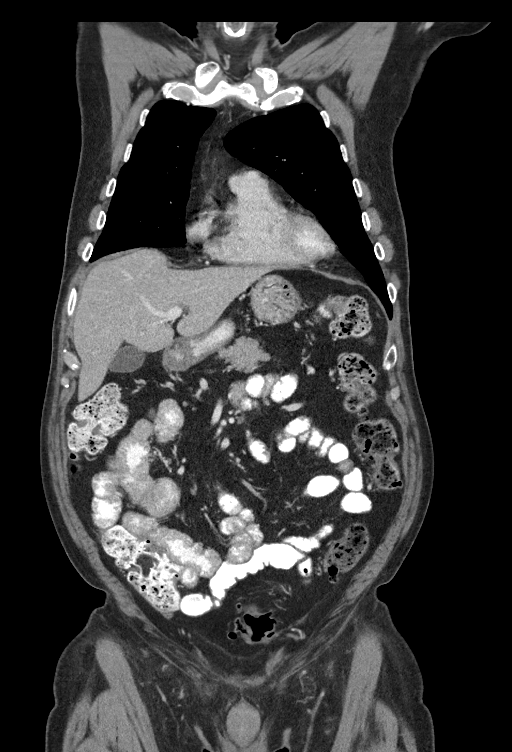
[im 71/160  soft-tissue]
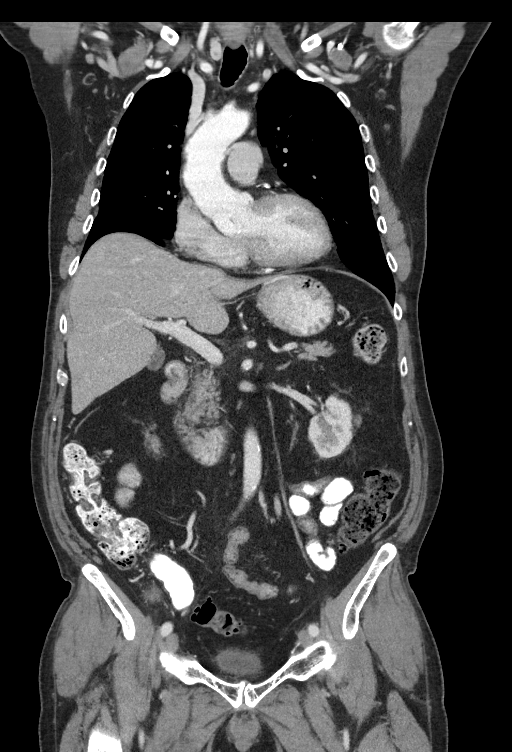
[im 89/160  soft-tissue]
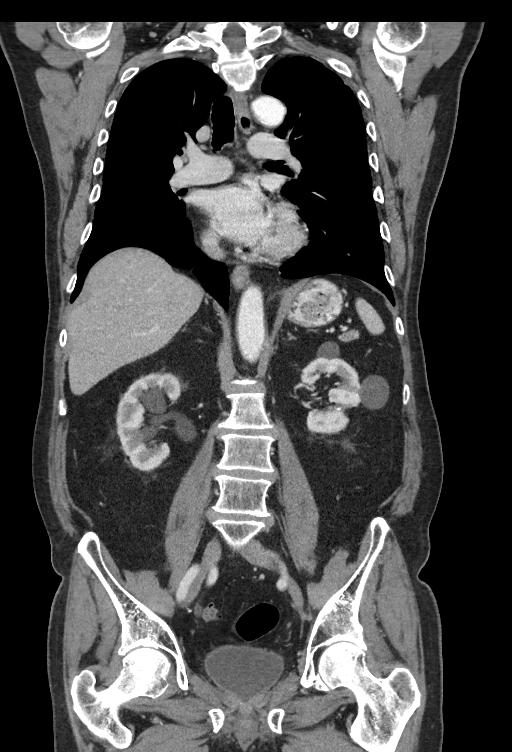

[13 of 46 positions shown; findings below may reference images not displayed]

FINDINGS: CT CHEST FINDINGS

Cardiovascular: Coronary, aortic arch, and branch vessel
atherosclerotic vascular disease. Ectatic ascending thoracic aorta
at 3.8 cm on image 33/2. Mild cardiomegaly.

Mediastinum/Nodes: No pathologic adenopathy in the chest.

Lungs/Pleura: Minimal biapical pleuroparenchymal scarring. No
findings of metastatic disease to the lungs.

Musculoskeletal: Mild thoracic spondylosis.

CT ABDOMEN PELVIS FINDINGS

Hepatobiliary: 3 mm hypodense lesion posteriorly in the lateral
segment left hepatic lobe, no change from the prior exam,
statistically likely to represent a small cyst or similar benign
lesion. Thin subtle calcification along the gallbladder wall on
image 64/2 measuring about 5 mm in length.

Pancreas: Unremarkable

Spleen: Unremarkable

Adrenals/Urinary Tract: Moderate right hydronephrosis and right
hydroureter extending down to a cluster of 3 calculi in the distal
ureter just proximal to the UVJ. There a 5 mm stone immediately
adjacent to a 4 mm stone on image 98/5, and just proximal to that
cluster there is a 2 mm stone in the distal ureter. No other
right-sided calculi are identified.

Left mid kidney 6 mm nonobstructive renal calculus. 3 mm left kidney
lower pole calculus within a caliceal diverticulum adjacent to a
region of scarring. There is an adjacent 5 mm calcification in this
area of scarring which is probably a renal calculus within the
calyceal diverticulum, less likely a parenchymal calcification.

Stomach/Bowel: Sigmoid colon diverticulosis without active
diverticulitis.

Vascular/Lymphatic: Aortoiliac atherosclerotic vascular disease. A
bandlike density posterior to the left external iliac vessels on
image 109/2 extending towards the upper margin of the left operator
internus measures 7 mm in short axis, and appear stable. No current
pelvic adenopathy.

Reproductive: Prostatectomy without local recurrence identified.

Other: No supplemental non-categorized findings.

Musculoskeletal: No compelling findings of osseous metastatic
disease. Large disc bulge at L3-4.
IMPRESSION: 1. No current adenopathy or findings metastatic disease. Prior
prostatectomy.
2. Obstructive cluster of distal calculi in the right distal ureter
is causing moderate right hydronephrosis and hydroureter. This
consists of a clustered of 5 mm and 4 mm stone along with a slightly
more proximal 2 mm stone in the distal ureter. There are also
nonobstructive left renal calculi.
3. Other imaging findings of potential clinical significance: Aortic
Atherosclerosis ([id number]). Coronary atherosclerosis. Mild
cardiomegaly. Sigmoid diverticulosis. Disc bulge at L3-4.

## 2018-10-31 ENCOUNTER — Other Ambulatory Visit: Payer: Self-pay | Admitting: Medical Oncology

## 2018-10-31 ENCOUNTER — Encounter: Payer: Self-pay | Admitting: Medical Oncology

## 2018-10-31 DIAGNOSIS — C61 Malignant neoplasm of prostate: Secondary | ICD-10-CM

## 2018-10-31 NOTE — Progress Notes (Signed)
Jared Jacobs and Sinclair Grooms, RN, [facility] emailed me to request follow up labs. Orders placed for testosterone and PSA. [name] is aware he will be contacted by scheduling with an appointment.

## 2018-11-04 ENCOUNTER — Other Ambulatory Visit: Payer: Self-pay

## 2018-11-04 ENCOUNTER — Inpatient Hospital Stay: Payer: Medicare Other | Attending: Radiation Oncology

## 2018-11-04 DIAGNOSIS — C61 Malignant neoplasm of prostate: Secondary | ICD-10-CM | POA: Diagnosis not present

## 2018-11-05 LAB — TESTOSTERONE: Testosterone: 307 ng/dL (ref 264–916)

## 2018-11-05 LAB — PROSTATE-SPECIFIC AG, SERUM (LABCORP): Prostate Specific Ag, Serum: <0.1 ng/mL (ref 0.0–4.0)

## 2018-11-07 DIAGNOSIS — H52203 Unspecified astigmatism, bilateral: Secondary | ICD-10-CM | POA: Diagnosis not present

## 2018-11-07 DIAGNOSIS — H5213 Myopia, bilateral: Secondary | ICD-10-CM | POA: Diagnosis not present

## 2018-11-07 DIAGNOSIS — H2513 Age-related nuclear cataract, bilateral: Secondary | ICD-10-CM | POA: Diagnosis not present

## 2018-12-28 DIAGNOSIS — Z23 Encounter for immunization: Secondary | ICD-10-CM | POA: Diagnosis not present

## 2018-12-31 ENCOUNTER — Encounter: Payer: Self-pay | Admitting: Medical Oncology

## 2018-12-31 ENCOUNTER — Other Ambulatory Visit: Payer: Self-pay | Admitting: Medical Oncology

## 2018-12-31 DIAGNOSIS — C61 Malignant neoplasm of prostate: Secondary | ICD-10-CM

## 2019-01-01 ENCOUNTER — Telehealth: Payer: Self-pay | Admitting: Radiation Oncology

## 2019-01-01 NOTE — Telephone Encounter (Signed)
Scheduled appt per 9/15 sch message - pt aware of appt date and time

## 2019-01-27 ENCOUNTER — Other Ambulatory Visit: Payer: Self-pay

## 2019-01-27 ENCOUNTER — Inpatient Hospital Stay: Payer: Medicare Other | Attending: Radiation Oncology

## 2019-01-27 DIAGNOSIS — C61 Malignant neoplasm of prostate: Secondary | ICD-10-CM | POA: Insufficient documentation

## 2019-01-28 LAB — TESTOSTERONE: Testosterone: 349 ng/dL (ref 264–916)

## 2019-01-28 LAB — PROSTATE-SPECIFIC AG, SERUM (LABCORP): Prostate Specific Ag, Serum: <0.1 ng/mL (ref 0.0–4.0)

## 2019-02-17 ENCOUNTER — Other Ambulatory Visit: Payer: Self-pay

## 2019-02-17 DIAGNOSIS — Z20822 Contact with and (suspected) exposure to covid-19: Secondary | ICD-10-CM

## 2019-02-17 DIAGNOSIS — Z20828 Contact with and (suspected) exposure to other viral communicable diseases: Secondary | ICD-10-CM | POA: Diagnosis not present

## 2019-02-18 LAB — NOVEL CORONAVIRUS, NAA: SARS-CoV-2, NAA: NOT DETECTED

## 2019-04-08 ENCOUNTER — Other Ambulatory Visit: Payer: Self-pay | Admitting: Medical Oncology

## 2019-04-08 ENCOUNTER — Encounter: Payer: Self-pay | Admitting: Medical Oncology

## 2019-04-08 DIAGNOSIS — C61 Malignant neoplasm of prostate: Secondary | ICD-10-CM

## 2019-04-21 ENCOUNTER — Inpatient Hospital Stay: Payer: Medicare Other | Attending: Radiation Oncology

## 2019-04-21 ENCOUNTER — Other Ambulatory Visit: Payer: Self-pay

## 2019-04-21 DIAGNOSIS — C61 Malignant neoplasm of prostate: Secondary | ICD-10-CM | POA: Insufficient documentation

## 2019-04-22 ENCOUNTER — Encounter: Payer: Self-pay | Admitting: Medical Oncology

## 2019-04-22 LAB — PROSTATE-SPECIFIC AG, SERUM (LABCORP): Prostate Specific Ag, Serum: <0.1 ng/mL (ref 0.0–4.0)

## 2019-04-22 LAB — TESTOSTERONE: Testosterone: 445 ng/dL (ref 264–916)

## 2019-04-22 NOTE — Progress Notes (Signed)
Lab results forward to patient and to [facility], RN-[facility] [employer] [phone].

## 2019-05-01 ENCOUNTER — Ambulatory Visit: Payer: Medicare Other | Attending: Internal Medicine

## 2019-05-01 DIAGNOSIS — Z23 Encounter for immunization: Secondary | ICD-10-CM | POA: Diagnosis not present

## 2019-05-01 NOTE — Progress Notes (Signed)
   Covid-19 Vaccination Clinic  Name:  Jared Jacobs    MRN: [id number] DOB: 07-20-1940  05/01/2019  Jared Jacobs was observed post Covid-19 immunization for 15 minutes without incidence. He was provided with Vaccine Information Sheet and instruction to access the V-Safe system.   Jared Jacobs was instructed to call 911 with any severe reactions post vaccine: Marland Kitchen Difficulty breathing  . Swelling of your face and throat  . A fast heartbeat  . A bad rash all over your body  . Dizziness and weakness    Immunizations Administered    Name Date Dose VIS Date Route   Pfizer COVID-19 Vaccine 05/01/2019  8:37 AM 0.3 mL 03/28/2019 Intramuscular   Manufacturer: [place]   Lot: [id number]   [place]: [id number]

## 2019-05-21 ENCOUNTER — Ambulatory Visit: Payer: Medicare Other | Attending: Internal Medicine

## 2019-05-21 DIAGNOSIS — Z23 Encounter for immunization: Secondary | ICD-10-CM

## 2019-05-21 NOTE — Progress Notes (Signed)
   Covid-19 Vaccination Clinic  Name:  Jared Jacobs    MRN: [id number] DOB: 1940/09/09  05/21/2019  Jared Jacobs was observed post Covid-19 immunization for 15 minutes without incidence. He was provided with Vaccine Information Sheet and instruction to access the V-Safe system.   Jared Jacobs was instructed to call 911 with any severe reactions post vaccine: Marland Kitchen Difficulty breathing  . Swelling of your face and throat  . A fast heartbeat  . A bad rash all over your body  . Dizziness and weakness    Immunizations Administered    Name Date Dose VIS Date Route   Pfizer COVID-19 Vaccine 05/21/2019  9:35 AM 0.3 mL 03/28/2019 Intramuscular   Manufacturer: [place]   Lot: [id number]   [place]: [id number]

## 2019-06-03 DIAGNOSIS — H2513 Age-related nuclear cataract, bilateral: Secondary | ICD-10-CM | POA: Diagnosis not present

## 2019-06-03 DIAGNOSIS — H5213 Myopia, bilateral: Secondary | ICD-10-CM | POA: Diagnosis not present

## 2019-07-01 ENCOUNTER — Encounter: Payer: Self-pay | Admitting: Medical Oncology

## 2019-07-01 ENCOUNTER — Other Ambulatory Visit: Payer: Self-pay | Admitting: Medical Oncology

## 2019-07-01 DIAGNOSIS — C61 Malignant neoplasm of prostate: Secondary | ICD-10-CM

## 2019-07-03 DIAGNOSIS — H2181 Floppy iris syndrome: Secondary | ICD-10-CM | POA: Diagnosis not present

## 2019-07-03 DIAGNOSIS — H2512 Age-related nuclear cataract, left eye: Secondary | ICD-10-CM | POA: Diagnosis not present

## 2019-07-03 DIAGNOSIS — H25812 Combined forms of age-related cataract, left eye: Secondary | ICD-10-CM | POA: Diagnosis not present

## 2019-07-17 ENCOUNTER — Inpatient Hospital Stay: Payer: Medicare Other | Attending: Radiation Oncology

## 2019-07-17 ENCOUNTER — Other Ambulatory Visit: Payer: Self-pay

## 2019-07-17 DIAGNOSIS — C61 Malignant neoplasm of prostate: Secondary | ICD-10-CM | POA: Diagnosis not present

## 2019-07-18 ENCOUNTER — Encounter: Payer: Self-pay | Admitting: Medical Oncology

## 2019-07-18 LAB — PROSTATE-SPECIFIC AG, SERUM (LABCORP): Prostate Specific Ag, Serum: <0.1 ng/mL (ref 0.0–4.0)

## 2019-07-18 LAB — TESTOSTERONE: Testosterone: 294 ng/dL (ref 264–916)

## 2019-07-18 NOTE — Progress Notes (Signed)
Research labs for [employer] faxed to Sinclair Grooms, Therapist, sports. I also sent her an e-mail to confirm she received. She is out of the office today but will confirm on Monday.

## 2019-07-25 DIAGNOSIS — E7849 Other hyperlipidemia: Secondary | ICD-10-CM | POA: Diagnosis not present

## 2019-07-25 DIAGNOSIS — E038 Other specified hypothyroidism: Secondary | ICD-10-CM | POA: Diagnosis not present

## 2019-08-04 DIAGNOSIS — K219 Gastro-esophageal reflux disease without esophagitis: Secondary | ICD-10-CM | POA: Diagnosis not present

## 2019-08-04 DIAGNOSIS — R82998 Other abnormal findings in urine: Secondary | ICD-10-CM | POA: Diagnosis not present

## 2019-08-04 DIAGNOSIS — N3010 Interstitial cystitis (chronic) without hematuria: Secondary | ICD-10-CM | POA: Diagnosis not present

## 2019-08-04 DIAGNOSIS — E785 Hyperlipidemia, unspecified: Secondary | ICD-10-CM | POA: Diagnosis not present

## 2019-08-04 DIAGNOSIS — Z86010 Personal history of colonic polyps: Secondary | ICD-10-CM | POA: Diagnosis not present

## 2019-08-04 DIAGNOSIS — K5900 Constipation, unspecified: Secondary | ICD-10-CM | POA: Diagnosis not present

## 2019-08-04 DIAGNOSIS — C779 Secondary and unspecified malignant neoplasm of lymph node, unspecified: Secondary | ICD-10-CM | POA: Diagnosis not present

## 2019-08-04 DIAGNOSIS — Z1331 Encounter for screening for depression: Secondary | ICD-10-CM | POA: Diagnosis not present

## 2019-08-04 DIAGNOSIS — E039 Hypothyroidism, unspecified: Secondary | ICD-10-CM | POA: Diagnosis not present

## 2019-08-04 DIAGNOSIS — C61 Malignant neoplasm of prostate: Secondary | ICD-10-CM | POA: Diagnosis not present

## 2019-08-04 DIAGNOSIS — Z0000 Encounter for general adult medical examination without abnormal findings: Secondary | ICD-10-CM | POA: Diagnosis not present

## 2019-08-05 DIAGNOSIS — Z1212 Encounter for screening for malignant neoplasm of rectum: Secondary | ICD-10-CM | POA: Diagnosis not present

## 2019-08-14 DIAGNOSIS — H25811 Combined forms of age-related cataract, right eye: Secondary | ICD-10-CM | POA: Diagnosis not present

## 2019-08-14 DIAGNOSIS — H2511 Age-related nuclear cataract, right eye: Secondary | ICD-10-CM | POA: Diagnosis not present

## 2019-10-02 ENCOUNTER — Encounter: Payer: Self-pay | Admitting: Medical Oncology

## 2019-10-02 ENCOUNTER — Other Ambulatory Visit: Payer: Self-pay | Admitting: Medical Oncology

## 2019-10-02 ENCOUNTER — Telehealth: Payer: Self-pay | Admitting: Radiation Oncology

## 2019-10-02 DIAGNOSIS — C61 Malignant neoplasm of prostate: Secondary | ICD-10-CM

## 2019-10-02 NOTE — Telephone Encounter (Signed)
Scheduled appt per 6/17 sch message - pt is aware of appt d/t

## 2019-10-07 DIAGNOSIS — R300 Dysuria: Secondary | ICD-10-CM | POA: Diagnosis not present

## 2019-10-16 ENCOUNTER — Other Ambulatory Visit: Payer: Self-pay

## 2019-10-16 ENCOUNTER — Inpatient Hospital Stay: Payer: Medicare Other | Attending: Radiation Oncology

## 2019-10-16 DIAGNOSIS — C61 Malignant neoplasm of prostate: Secondary | ICD-10-CM | POA: Diagnosis not present

## 2019-10-16 DIAGNOSIS — R300 Dysuria: Secondary | ICD-10-CM | POA: Diagnosis not present

## 2019-10-17 ENCOUNTER — Encounter: Payer: Self-pay | Admitting: Medical Oncology

## 2019-10-17 LAB — PROSTATE-SPECIFIC AG, SERUM (LABCORP): Prostate Specific Ag, Serum: <0.1 ng/mL (ref 0.0–4.0)

## 2019-10-17 LAB — TESTOSTERONE: Testosterone: 394 ng/dL (ref 264–916)

## 2019-10-17 NOTE — Progress Notes (Signed)
PSA and testosterone labs were faxed to Sinclair Grooms, RN @ [facility]. Fax [phone] and [phone]

## 2019-10-30 DIAGNOSIS — N3000 Acute cystitis without hematuria: Secondary | ICD-10-CM | POA: Diagnosis not present

## 2019-10-30 DIAGNOSIS — R300 Dysuria: Secondary | ICD-10-CM | POA: Diagnosis not present

## 2019-12-09 ENCOUNTER — Ambulatory Visit: Payer: Medicare Other | Attending: Internal Medicine

## 2019-12-09 DIAGNOSIS — Z23 Encounter for immunization: Secondary | ICD-10-CM

## 2019-12-09 NOTE — Progress Notes (Signed)
   Covid-19 Vaccination Clinic  Name:  SAMPSON SELF    MRN: [id number] DOB: 1940-10-02  12/09/2019  Mr. Duddy was observed post Covid-19 immunization for 15 minutes without incident. He was provided with Vaccine Information Sheet and instruction to access the V-Safe system.   Mr. Logan was instructed to call 911 with any severe reactions post vaccine: Marland Kitchen Difficulty breathing  . Swelling of face and throat  . A fast heartbeat  . A bad rash all over body  . Dizziness and weakness

## 2020-01-06 ENCOUNTER — Other Ambulatory Visit: Payer: Self-pay | Admitting: Medical Oncology

## 2020-01-06 ENCOUNTER — Encounter: Payer: Self-pay | Admitting: Medical Oncology

## 2020-01-06 DIAGNOSIS — C61 Malignant neoplasm of prostate: Secondary | ICD-10-CM

## 2020-01-16 ENCOUNTER — Other Ambulatory Visit: Payer: Self-pay

## 2020-01-16 ENCOUNTER — Inpatient Hospital Stay: Payer: Medicare Other | Attending: Radiation Oncology

## 2020-01-16 DIAGNOSIS — C61 Malignant neoplasm of prostate: Secondary | ICD-10-CM | POA: Diagnosis not present

## 2020-01-17 DIAGNOSIS — Z23 Encounter for immunization: Secondary | ICD-10-CM | POA: Diagnosis not present

## 2020-01-17 LAB — PROSTATE-SPECIFIC AG, SERUM (LABCORP): Prostate Specific Ag, Serum: <0.1 ng/mL (ref 0.0–4.0)

## 2020-01-17 LAB — TESTOSTERONE: Testosterone: 379 ng/dL (ref 264–916)

## 2020-04-06 ENCOUNTER — Encounter: Payer: Self-pay | Admitting: Medical Oncology

## 2020-04-06 ENCOUNTER — Other Ambulatory Visit: Payer: Self-pay | Admitting: Medical Oncology

## 2020-04-06 DIAGNOSIS — C61 Malignant neoplasm of prostate: Secondary | ICD-10-CM

## 2020-04-06 NOTE — Progress Notes (Signed)
Patient needs to be scheduled for PSA and testosterone levels per [facility]. Patient is aware schedulers will contact him with an appointment.

## 2020-04-21 ENCOUNTER — Ambulatory Visit
Admission: RE | Admit: 2020-04-21 | Discharge: 2020-04-21 | Disposition: A | Discharge status: Home / Self Care | Payer: Medicare Other | Source: Ambulatory Visit | Attending: Radiation Oncology | Admitting: Radiation Oncology

## 2020-04-21 ENCOUNTER — Other Ambulatory Visit: Payer: Self-pay

## 2020-04-21 DIAGNOSIS — C61 Malignant neoplasm of prostate: Secondary | ICD-10-CM

## 2020-04-22 ENCOUNTER — Encounter: Payer: Self-pay | Admitting: Medical Oncology

## 2020-04-22 LAB — TESTOSTERONE: Testosterone: 367 ng/dL (ref 264–916)

## 2020-04-22 LAB — PROSTATE-SPECIFIC AG, SERUM (LABCORP): Prostate Specific Ag, Serum: <0.1 ng/mL (ref 0.0–4.0)

## 2020-07-12 ENCOUNTER — Other Ambulatory Visit: Payer: Self-pay | Admitting: Medical Oncology

## 2020-07-12 ENCOUNTER — Encounter: Payer: Self-pay | Admitting: Medical Oncology

## 2020-07-12 DIAGNOSIS — C61 Malignant neoplasm of prostate: Secondary | ICD-10-CM

## 2020-07-19 ENCOUNTER — Inpatient Hospital Stay: Payer: Medicare Other | Attending: Medical Oncology

## 2020-07-19 ENCOUNTER — Other Ambulatory Visit: Payer: Self-pay

## 2020-07-19 DIAGNOSIS — C61 Malignant neoplasm of prostate: Secondary | ICD-10-CM | POA: Diagnosis not present

## 2020-07-20 LAB — TESTOSTERONE: Testosterone: 378 ng/dL (ref 264–916)

## 2020-07-20 LAB — PROSTATE-SPECIFIC AG, SERUM (LABCORP): Prostate Specific Ag, Serum: <0.1 ng/mL (ref 0.0–4.0)

## 2020-08-10 DIAGNOSIS — E785 Hyperlipidemia, unspecified: Secondary | ICD-10-CM | POA: Diagnosis not present

## 2020-08-10 DIAGNOSIS — Z125 Encounter for screening for malignant neoplasm of prostate: Secondary | ICD-10-CM | POA: Diagnosis not present

## 2020-08-10 DIAGNOSIS — E039 Hypothyroidism, unspecified: Secondary | ICD-10-CM | POA: Diagnosis not present

## 2020-08-13 DIAGNOSIS — R059 Cough, unspecified: Secondary | ICD-10-CM | POA: Diagnosis not present

## 2020-08-13 DIAGNOSIS — J029 Acute pharyngitis, unspecified: Secondary | ICD-10-CM | POA: Diagnosis not present

## 2020-08-13 DIAGNOSIS — Z20822 Contact with and (suspected) exposure to covid-19: Secondary | ICD-10-CM | POA: Diagnosis not present

## 2020-08-13 DIAGNOSIS — J069 Acute upper respiratory infection, unspecified: Secondary | ICD-10-CM | POA: Diagnosis not present

## 2020-08-17 DIAGNOSIS — Z1331 Encounter for screening for depression: Secondary | ICD-10-CM | POA: Diagnosis not present

## 2020-08-17 DIAGNOSIS — N3010 Interstitial cystitis (chronic) without hematuria: Secondary | ICD-10-CM | POA: Diagnosis not present

## 2020-08-17 DIAGNOSIS — Z1212 Encounter for screening for malignant neoplasm of rectum: Secondary | ICD-10-CM | POA: Diagnosis not present

## 2020-08-17 DIAGNOSIS — Z0000 Encounter for general adult medical examination without abnormal findings: Secondary | ICD-10-CM | POA: Diagnosis not present

## 2020-08-17 DIAGNOSIS — R82998 Other abnormal findings in urine: Secondary | ICD-10-CM | POA: Diagnosis not present

## 2020-08-17 DIAGNOSIS — C779 Secondary and unspecified malignant neoplasm of lymph node, unspecified: Secondary | ICD-10-CM | POA: Diagnosis not present

## 2020-08-17 DIAGNOSIS — J069 Acute upper respiratory infection, unspecified: Secondary | ICD-10-CM | POA: Diagnosis not present

## 2020-08-17 DIAGNOSIS — Z1389 Encounter for screening for other disorder: Secondary | ICD-10-CM | POA: Diagnosis not present

## 2020-08-17 DIAGNOSIS — E039 Hypothyroidism, unspecified: Secondary | ICD-10-CM | POA: Diagnosis not present

## 2020-08-17 DIAGNOSIS — C61 Malignant neoplasm of prostate: Secondary | ICD-10-CM | POA: Diagnosis not present

## 2020-10-06 ENCOUNTER — Other Ambulatory Visit: Payer: Self-pay | Admitting: Medical Oncology

## 2020-10-06 DIAGNOSIS — C61 Malignant neoplasm of prostate: Secondary | ICD-10-CM

## 2020-10-06 NOTE — Progress Notes (Signed)
Labs ordered for per [facility].

## 2020-10-20 ENCOUNTER — Inpatient Hospital Stay: Payer: Medicare Other | Attending: Medical Oncology

## 2020-10-20 ENCOUNTER — Other Ambulatory Visit: Payer: Self-pay

## 2020-10-20 DIAGNOSIS — C61 Malignant neoplasm of prostate: Secondary | ICD-10-CM | POA: Diagnosis not present

## 2020-10-21 LAB — PROSTATE-SPECIFIC AG, SERUM (LABCORP): Prostate Specific Ag, Serum: <0.1 ng/mL (ref 0.0–4.0)

## 2020-10-21 LAB — TESTOSTERONE: Testosterone: 450 ng/dL (ref 264–916)

## 2020-12-17 DIAGNOSIS — C61 Malignant neoplasm of prostate: Secondary | ICD-10-CM | POA: Diagnosis not present

## 2020-12-17 DIAGNOSIS — N3010 Interstitial cystitis (chronic) without hematuria: Secondary | ICD-10-CM | POA: Diagnosis not present

## 2020-12-30 ENCOUNTER — Other Ambulatory Visit: Payer: Self-pay | Admitting: Medical Oncology

## 2020-12-30 ENCOUNTER — Encounter: Payer: Self-pay | Admitting: Medical Oncology

## 2020-12-30 DIAGNOSIS — C61 Malignant neoplasm of prostate: Secondary | ICD-10-CM

## 2020-12-30 NOTE — Progress Notes (Signed)
Labs requested per [facility].

## 2021-01-17 ENCOUNTER — Inpatient Hospital Stay: Payer: Medicare Other | Attending: Medical Oncology

## 2021-01-17 ENCOUNTER — Other Ambulatory Visit: Payer: Self-pay

## 2021-01-17 DIAGNOSIS — C61 Malignant neoplasm of prostate: Secondary | ICD-10-CM | POA: Insufficient documentation

## 2021-01-18 LAB — PROSTATE-SPECIFIC AG, SERUM (LABCORP): Prostate Specific Ag, Serum: <0.1 ng/mL (ref 0.0–4.0)

## 2021-01-18 LAB — TESTOSTERONE: Testosterone: 404 ng/dL (ref 264–916)

## 2021-01-25 DIAGNOSIS — H47022 Hemorrhage in optic nerve sheath, left eye: Secondary | ICD-10-CM | POA: Diagnosis not present

## 2021-01-25 DIAGNOSIS — Z961 Presence of intraocular lens: Secondary | ICD-10-CM | POA: Diagnosis not present

## 2021-02-02 DIAGNOSIS — R3912 Poor urinary stream: Secondary | ICD-10-CM | POA: Diagnosis not present

## 2021-02-02 DIAGNOSIS — C61 Malignant neoplasm of prostate: Secondary | ICD-10-CM | POA: Diagnosis not present

## 2021-04-14 ENCOUNTER — Other Ambulatory Visit: Payer: Self-pay

## 2021-04-14 DIAGNOSIS — C61 Malignant neoplasm of prostate: Secondary | ICD-10-CM

## 2021-04-14 NOTE — Progress Notes (Signed)
Labs requested per [facility].

## 2021-04-21 ENCOUNTER — Other Ambulatory Visit: Payer: Self-pay

## 2021-04-21 ENCOUNTER — Encounter: Payer: Self-pay | Admitting: Oncology

## 2021-04-21 ENCOUNTER — Inpatient Hospital Stay: Payer: Medicare Other | Attending: Medical Oncology

## 2021-04-21 DIAGNOSIS — C61 Malignant neoplasm of prostate: Secondary | ICD-10-CM | POA: Diagnosis not present

## 2021-04-22 LAB — PROSTATE-SPECIFIC AG, SERUM (LABCORP): Prostate Specific Ag, Serum: <0.1 ng/mL (ref 0.0–4.0)

## 2021-04-22 LAB — TESTOSTERONE: Testosterone: 378 ng/dL (ref 264–916)

## 2021-05-06 ENCOUNTER — Encounter: Payer: Self-pay | Admitting: Oncology

## 2021-07-04 ENCOUNTER — Other Ambulatory Visit: Payer: Self-pay

## 2021-07-04 DIAGNOSIS — C61 Malignant neoplasm of prostate: Secondary | ICD-10-CM

## 2021-07-18 ENCOUNTER — Other Ambulatory Visit: Payer: Self-pay

## 2021-07-18 ENCOUNTER — Inpatient Hospital Stay: Payer: Medicare Other | Attending: Medical Oncology

## 2021-07-18 DIAGNOSIS — C61 Malignant neoplasm of prostate: Secondary | ICD-10-CM | POA: Insufficient documentation

## 2021-07-19 LAB — TESTOSTERONE: Testosterone: 397 ng/dL (ref 264–916)

## 2021-07-19 LAB — PROSTATE-SPECIFIC AG, SERUM (LABCORP): Prostate Specific Ag, Serum: <0.1 ng/mL (ref 0.0–4.0)

## 2021-07-28 DIAGNOSIS — H40013 Open angle with borderline findings, low risk, bilateral: Secondary | ICD-10-CM | POA: Diagnosis not present

## 2021-08-24 DIAGNOSIS — E785 Hyperlipidemia, unspecified: Secondary | ICD-10-CM | POA: Diagnosis not present

## 2021-08-24 DIAGNOSIS — E039 Hypothyroidism, unspecified: Secondary | ICD-10-CM | POA: Diagnosis not present

## 2021-08-24 DIAGNOSIS — Z125 Encounter for screening for malignant neoplasm of prostate: Secondary | ICD-10-CM | POA: Diagnosis not present

## 2021-08-31 DIAGNOSIS — Z8546 Personal history of malignant neoplasm of prostate: Secondary | ICD-10-CM | POA: Diagnosis not present

## 2021-08-31 DIAGNOSIS — Z1331 Encounter for screening for depression: Secondary | ICD-10-CM | POA: Diagnosis not present

## 2021-08-31 DIAGNOSIS — Z1339 Encounter for screening examination for other mental health and behavioral disorders: Secondary | ICD-10-CM | POA: Diagnosis not present

## 2021-08-31 DIAGNOSIS — Z0000 Encounter for general adult medical examination without abnormal findings: Secondary | ICD-10-CM | POA: Diagnosis not present

## 2021-08-31 DIAGNOSIS — K219 Gastro-esophageal reflux disease without esophagitis: Secondary | ICD-10-CM | POA: Diagnosis not present

## 2021-08-31 DIAGNOSIS — E785 Hyperlipidemia, unspecified: Secondary | ICD-10-CM | POA: Diagnosis not present

## 2021-08-31 DIAGNOSIS — N3010 Interstitial cystitis (chronic) without hematuria: Secondary | ICD-10-CM | POA: Diagnosis not present

## 2021-08-31 DIAGNOSIS — E039 Hypothyroidism, unspecified: Secondary | ICD-10-CM | POA: Diagnosis not present

## 2021-11-04 ENCOUNTER — Other Ambulatory Visit: Payer: Self-pay

## 2021-11-04 DIAGNOSIS — C61 Malignant neoplasm of prostate: Secondary | ICD-10-CM

## 2021-11-15 ENCOUNTER — Inpatient Hospital Stay: Payer: Medicare Other | Attending: Internal Medicine

## 2021-11-15 DIAGNOSIS — C61 Malignant neoplasm of prostate: Secondary | ICD-10-CM | POA: Insufficient documentation

## 2021-11-16 LAB — PROSTATE-SPECIFIC AG, SERUM (LABCORP): Prostate Specific Ag, Serum: <0.1 ng/mL (ref 0.0–4.0)

## 2021-11-16 LAB — TESTOSTERONE: Testosterone: 344 ng/dL (ref 264–916)

## 2022-03-01 ENCOUNTER — Other Ambulatory Visit: Payer: Self-pay

## 2022-03-01 DIAGNOSIS — C61 Malignant neoplasm of prostate: Secondary | ICD-10-CM

## 2022-03-01 NOTE — Progress Notes (Signed)
  Labs requested per [facility], patient notified.

## 2022-03-17 ENCOUNTER — Other Ambulatory Visit: Payer: Self-pay | Admitting: Lab

## 2022-03-20 ENCOUNTER — Inpatient Hospital Stay: Payer: Medicare Other | Attending: Oncology

## 2022-03-20 ENCOUNTER — Other Ambulatory Visit: Payer: Self-pay

## 2022-03-20 DIAGNOSIS — C61 Malignant neoplasm of prostate: Secondary | ICD-10-CM | POA: Diagnosis not present

## 2022-03-21 LAB — PROSTATE-SPECIFIC AG, SERUM (LABCORP): Prostate Specific Ag, Serum: <0.1 ng/mL (ref 0.0–4.0)

## 2022-07-31 ENCOUNTER — Other Ambulatory Visit: Payer: Self-pay

## 2022-07-31 DIAGNOSIS — C61 Malignant neoplasm of prostate: Secondary | ICD-10-CM

## 2022-07-31 NOTE — Progress Notes (Signed)
Labs requested per [facility], patient notified.  ? ?

## 2022-08-16 ENCOUNTER — Inpatient Hospital Stay: Payer: Medicare Other | Attending: Radiation Oncology

## 2022-08-16 DIAGNOSIS — C61 Malignant neoplasm of prostate: Secondary | ICD-10-CM | POA: Insufficient documentation

## 2022-08-18 LAB — PROSTATE-SPECIFIC AG, SERUM (LABCORP): Prostate Specific Ag, Serum: <0.1 ng/mL (ref 0.0–4.0)

## 2022-09-01 DIAGNOSIS — K219 Gastro-esophageal reflux disease without esophagitis: Secondary | ICD-10-CM | POA: Diagnosis not present

## 2022-09-01 DIAGNOSIS — E785 Hyperlipidemia, unspecified: Secondary | ICD-10-CM | POA: Diagnosis not present

## 2022-09-01 DIAGNOSIS — E039 Hypothyroidism, unspecified: Secondary | ICD-10-CM | POA: Diagnosis not present

## 2022-09-08 DIAGNOSIS — E039 Hypothyroidism, unspecified: Secondary | ICD-10-CM | POA: Diagnosis not present

## 2022-09-08 DIAGNOSIS — E785 Hyperlipidemia, unspecified: Secondary | ICD-10-CM | POA: Diagnosis not present

## 2022-09-08 DIAGNOSIS — I700 Atherosclerosis of aorta: Secondary | ICD-10-CM | POA: Diagnosis not present

## 2022-09-08 DIAGNOSIS — N3010 Interstitial cystitis (chronic) without hematuria: Secondary | ICD-10-CM | POA: Diagnosis not present

## 2022-09-08 DIAGNOSIS — K219 Gastro-esophageal reflux disease without esophagitis: Secondary | ICD-10-CM | POA: Diagnosis not present

## 2022-09-08 DIAGNOSIS — N1831 Chronic kidney disease, stage 3a: Secondary | ICD-10-CM | POA: Diagnosis not present

## 2022-09-08 DIAGNOSIS — Z1331 Encounter for screening for depression: Secondary | ICD-10-CM | POA: Diagnosis not present

## 2022-09-08 DIAGNOSIS — Z1339 Encounter for screening examination for other mental health and behavioral disorders: Secondary | ICD-10-CM | POA: Diagnosis not present

## 2022-09-08 DIAGNOSIS — Z0000 Encounter for general adult medical examination without abnormal findings: Secondary | ICD-10-CM | POA: Diagnosis not present

## 2022-09-08 DIAGNOSIS — Z8546 Personal history of malignant neoplasm of prostate: Secondary | ICD-10-CM | POA: Diagnosis not present

## 2022-11-14 DIAGNOSIS — R202 Paresthesia of skin: Secondary | ICD-10-CM | POA: Diagnosis not present

## 2022-11-14 DIAGNOSIS — C61 Malignant neoplasm of prostate: Secondary | ICD-10-CM | POA: Diagnosis not present

## 2022-11-14 DIAGNOSIS — C779 Secondary and unspecified malignant neoplasm of lymph node, unspecified: Secondary | ICD-10-CM | POA: Diagnosis not present

## 2022-11-27 ENCOUNTER — Other Ambulatory Visit: Payer: Self-pay

## 2022-11-27 DIAGNOSIS — C61 Malignant neoplasm of prostate: Secondary | ICD-10-CM

## 2022-11-27 NOTE — Progress Notes (Signed)
Labs requested per [facility], patient aware.

## 2022-12-19 ENCOUNTER — Inpatient Hospital Stay: Payer: Medicare Other | Attending: Internal Medicine

## 2022-12-19 DIAGNOSIS — C61 Malignant neoplasm of prostate: Secondary | ICD-10-CM | POA: Diagnosis not present

## 2022-12-20 LAB — PROSTATE-SPECIFIC AG, SERUM (LABCORP): Prostate Specific Ag, Serum: <0.1 ng/mL (ref 0.0–4.0)

## 2023-03-29 ENCOUNTER — Other Ambulatory Visit: Payer: Self-pay

## 2023-03-29 DIAGNOSIS — C61 Malignant neoplasm of prostate: Secondary | ICD-10-CM

## 2023-03-29 NOTE — Progress Notes (Signed)
Labs requested per [facility], patient aware.

## 2023-04-19 ENCOUNTER — Inpatient Hospital Stay: Payer: Medicare Other | Attending: Internal Medicine

## 2023-04-19 DIAGNOSIS — C61 Malignant neoplasm of prostate: Secondary | ICD-10-CM | POA: Diagnosis not present

## 2023-04-20 LAB — PROSTATE-SPECIFIC AG, SERUM (LABCORP): Prostate Specific Ag, Serum: <0.1 ng/mL (ref 0.0–4.0)

## 2023-07-24 ENCOUNTER — Other Ambulatory Visit: Payer: Self-pay

## 2023-07-24 DIAGNOSIS — C61 Malignant neoplasm of prostate: Secondary | ICD-10-CM

## 2023-07-24 NOTE — Progress Notes (Signed)
Labs requested per [facility], patient aware.

## 2023-08-16 ENCOUNTER — Inpatient Hospital Stay: Attending: Internal Medicine

## 2023-08-16 DIAGNOSIS — C61 Malignant neoplasm of prostate: Secondary | ICD-10-CM | POA: Insufficient documentation

## 2023-08-17 LAB — PROSTATE-SPECIFIC AG, SERUM (LABCORP): Prostate Specific Ag, Serum: <0.1 ng/mL (ref 0.0–4.0)

## 2023-09-04 DIAGNOSIS — Z8546 Personal history of malignant neoplasm of prostate: Secondary | ICD-10-CM | POA: Diagnosis not present

## 2023-09-04 DIAGNOSIS — E039 Hypothyroidism, unspecified: Secondary | ICD-10-CM | POA: Diagnosis not present

## 2023-09-04 DIAGNOSIS — N1831 Chronic kidney disease, stage 3a: Secondary | ICD-10-CM | POA: Diagnosis not present

## 2023-09-04 DIAGNOSIS — E785 Hyperlipidemia, unspecified: Secondary | ICD-10-CM | POA: Diagnosis not present

## 2023-10-10 DIAGNOSIS — K219 Gastro-esophageal reflux disease without esophagitis: Secondary | ICD-10-CM | POA: Diagnosis not present

## 2023-10-10 DIAGNOSIS — E039 Hypothyroidism, unspecified: Secondary | ICD-10-CM | POA: Diagnosis not present

## 2023-10-10 DIAGNOSIS — N3010 Interstitial cystitis (chronic) without hematuria: Secondary | ICD-10-CM | POA: Diagnosis not present

## 2023-10-10 DIAGNOSIS — Z23 Encounter for immunization: Secondary | ICD-10-CM | POA: Diagnosis not present

## 2023-10-10 DIAGNOSIS — Z0000 Encounter for general adult medical examination without abnormal findings: Secondary | ICD-10-CM | POA: Diagnosis not present

## 2023-10-10 DIAGNOSIS — G609 Hereditary and idiopathic neuropathy, unspecified: Secondary | ICD-10-CM | POA: Diagnosis not present

## 2023-10-10 DIAGNOSIS — I700 Atherosclerosis of aorta: Secondary | ICD-10-CM | POA: Diagnosis not present

## 2023-10-10 DIAGNOSIS — Z1331 Encounter for screening for depression: Secondary | ICD-10-CM | POA: Diagnosis not present

## 2023-10-10 DIAGNOSIS — Z1339 Encounter for screening examination for other mental health and behavioral disorders: Secondary | ICD-10-CM | POA: Diagnosis not present

## 2023-10-10 DIAGNOSIS — Z8546 Personal history of malignant neoplasm of prostate: Secondary | ICD-10-CM | POA: Diagnosis not present

## 2023-10-10 DIAGNOSIS — D649 Anemia, unspecified: Secondary | ICD-10-CM | POA: Diagnosis not present

## 2023-10-10 DIAGNOSIS — N1831 Chronic kidney disease, stage 3a: Secondary | ICD-10-CM | POA: Diagnosis not present

## 2023-10-10 DIAGNOSIS — E785 Hyperlipidemia, unspecified: Secondary | ICD-10-CM | POA: Diagnosis not present

## 2023-10-10 DIAGNOSIS — C779 Secondary and unspecified malignant neoplasm of lymph node, unspecified: Secondary | ICD-10-CM | POA: Diagnosis not present

## 2023-10-10 DIAGNOSIS — R159 Full incontinence of feces: Secondary | ICD-10-CM | POA: Diagnosis not present

## 2023-11-29 ENCOUNTER — Other Ambulatory Visit: Payer: Self-pay

## 2023-11-29 DIAGNOSIS — C61 Malignant neoplasm of prostate: Secondary | ICD-10-CM

## 2023-11-29 NOTE — Progress Notes (Signed)
Labs requested per [facility], patient aware.

## 2023-12-18 ENCOUNTER — Inpatient Hospital Stay: Attending: Radiation Oncology

## 2023-12-18 DIAGNOSIS — C61 Malignant neoplasm of prostate: Secondary | ICD-10-CM | POA: Insufficient documentation

## 2023-12-19 LAB — PROSTATE-SPECIFIC AG, SERUM (LABCORP): Prostate Specific Ag, Serum: <0.1 ng/mL (ref 0.0–4.0)

## 2024-04-14 ENCOUNTER — Other Ambulatory Visit: Payer: Self-pay

## 2024-04-14 DIAGNOSIS — C61 Malignant neoplasm of prostate: Secondary | ICD-10-CM

## 2024-04-14 NOTE — Progress Notes (Signed)
Labs requested per [facility], patient aware.

## 2024-04-15 ENCOUNTER — Encounter: Payer: Self-pay | Admitting: Oncology

## 2024-04-21 ENCOUNTER — Inpatient Hospital Stay: Attending: Nurse Practitioner

## 2024-04-21 DIAGNOSIS — C61 Malignant neoplasm of prostate: Secondary | ICD-10-CM | POA: Diagnosis present

## 2024-04-21 LAB — PSA: Prostatic Specific Antigen: <0.02 ng/mL (ref 0.00–4.00)
